# Patient Record
Sex: Male | Born: 1940 | Race: White | Hispanic: No | Marital: Married | Smoking: Former smoker
Health system: Southern US, Community
[De-identification: ages and names within clinical notes are randomized; demographics above are authoritative.]

## PROBLEM LIST (undated history)

## (undated) DIAGNOSIS — M179 Osteoarthritis of knee, unspecified: Secondary | ICD-10-CM

## (undated) DIAGNOSIS — T883XXA Malignant hyperthermia due to anesthesia, initial encounter: Secondary | ICD-10-CM

## (undated) DIAGNOSIS — M51369 Other intervertebral disc degeneration, lumbar region without mention of lumbar back pain or lower extremity pain: Secondary | ICD-10-CM

## (undated) DIAGNOSIS — N189 Chronic kidney disease, unspecified: Secondary | ICD-10-CM

## (undated) DIAGNOSIS — M5136 Other intervertebral disc degeneration, lumbar region: Secondary | ICD-10-CM

## (undated) DIAGNOSIS — Z791 Long term (current) use of non-steroidal anti-inflammatories (NSAID): Secondary | ICD-10-CM

## (undated) DIAGNOSIS — T81718A Complication of other artery following a procedure, not elsewhere classified, initial encounter: Secondary | ICD-10-CM

## (undated) DIAGNOSIS — I2699 Other pulmonary embolism without acute cor pulmonale: Secondary | ICD-10-CM

## (undated) DIAGNOSIS — M1710 Unilateral primary osteoarthritis, unspecified knee: Secondary | ICD-10-CM

## (undated) DIAGNOSIS — K219 Gastro-esophageal reflux disease without esophagitis: Secondary | ICD-10-CM

## (undated) DIAGNOSIS — R0602 Shortness of breath: Secondary | ICD-10-CM

## (undated) HISTORY — PX: BACK SURGERY: SHX140

## (undated) HISTORY — PX: OTHER SURGICAL HISTORY: SHX169

---

## 2003-05-23 ENCOUNTER — Ambulatory Visit (HOSPITAL_COMMUNITY): Admission: RE | Admit: 2003-05-23 | Discharge: 2003-05-23 | Payer: Self-pay | Admitting: Gastroenterology

## 2003-08-15 ENCOUNTER — Encounter: Admission: RE | Admit: 2003-08-15 | Discharge: 2003-08-15 | Payer: Self-pay | Admitting: Family Medicine

## 2003-09-05 ENCOUNTER — Encounter: Admission: RE | Admit: 2003-09-05 | Discharge: 2003-09-05 | Payer: Self-pay | Admitting: Neurosurgery

## 2003-09-20 ENCOUNTER — Encounter: Admission: RE | Admit: 2003-09-20 | Discharge: 2003-09-20 | Payer: Self-pay | Admitting: Neurosurgery

## 2003-10-17 ENCOUNTER — Emergency Department (HOSPITAL_COMMUNITY): Admission: EM | Admit: 2003-10-17 | Discharge: 2003-10-17 | Payer: Self-pay | Admitting: Emergency Medicine

## 2003-10-18 ENCOUNTER — Encounter: Admission: RE | Admit: 2003-10-18 | Discharge: 2003-10-18 | Payer: Self-pay | Admitting: Neurosurgery

## 2003-10-30 ENCOUNTER — Encounter: Admission: RE | Admit: 2003-10-30 | Discharge: 2003-11-16 | Payer: Self-pay | Admitting: Neurosurgery

## 2003-11-19 ENCOUNTER — Inpatient Hospital Stay (HOSPITAL_COMMUNITY): Admission: RE | Admit: 2003-11-19 | Discharge: 2003-11-21 | Payer: Self-pay | Admitting: Neurosurgery

## 2003-12-28 ENCOUNTER — Inpatient Hospital Stay (HOSPITAL_COMMUNITY): Admission: EM | Admit: 2003-12-28 | Discharge: 2003-12-31 | Payer: Self-pay | Admitting: Emergency Medicine

## 2004-03-10 ENCOUNTER — Encounter: Admission: RE | Admit: 2004-03-10 | Discharge: 2004-04-08 | Payer: Self-pay | Admitting: Neurosurgery

## 2004-08-06 ENCOUNTER — Encounter: Admission: RE | Admit: 2004-08-06 | Discharge: 2004-08-06 | Payer: Self-pay | Admitting: Family Medicine

## 2006-11-15 ENCOUNTER — Encounter: Admission: RE | Admit: 2006-11-15 | Discharge: 2006-11-15 | Payer: Self-pay | Admitting: Neurosurgery

## 2007-03-12 ENCOUNTER — Emergency Department (HOSPITAL_COMMUNITY): Admission: EM | Admit: 2007-03-12 | Discharge: 2007-03-12 | Payer: Self-pay | Admitting: Emergency Medicine

## 2007-11-18 ENCOUNTER — Ambulatory Visit: Payer: Self-pay | Admitting: Vascular Surgery

## 2008-11-30 ENCOUNTER — Observation Stay (HOSPITAL_COMMUNITY): Admission: EM | Admit: 2008-11-30 | Discharge: 2008-12-02 | Payer: Self-pay | Admitting: Emergency Medicine

## 2010-10-06 LAB — COMPREHENSIVE METABOLIC PANEL
ALT: 17 U/L (ref 0–53)
Alkaline Phosphatase: 54 U/L (ref 39–117)
CO2: 25 mEq/L (ref 19–32)
Glucose, Bld: 124 mg/dL — ABNORMAL HIGH (ref 70–99)
Potassium: 3.4 mEq/L — ABNORMAL LOW (ref 3.5–5.1)
Sodium: 144 mEq/L (ref 135–145)
Total Protein: 6.7 g/dL (ref 6.0–8.3)

## 2010-10-06 LAB — LIPID PANEL
Cholesterol: 147 mg/dL (ref 0–200)
HDL: 44 mg/dL (ref >39)

## 2010-10-06 LAB — POCT CARDIAC MARKERS
CKMB, poc: <1 ng/mL — ABNORMAL LOW (ref 1.0–8.0)
Troponin i, poc: <0.05 ng/mL (ref 0.00–0.09)

## 2010-10-06 LAB — URINE CULTURE: Culture: NO GROWTH

## 2010-10-06 LAB — CARDIAC PANEL(CRET KIN+CKTOT+MB+TROPI)
CK, MB: 1.5 ng/mL (ref 0.3–4.0)
CK, MB: 1.8 ng/mL (ref 0.3–4.0)
Relative Index: INVALID (ref 0.0–2.5)
Relative Index: INVALID (ref 0.0–2.5)
Total CK: 63 U/L (ref 7–232)
Total CK: 73 U/L (ref 7–232)
Troponin I: 0.01 ng/mL (ref 0.00–0.06)
Troponin I: 0.01 ng/mL (ref 0.00–0.06)

## 2010-10-06 LAB — DIFFERENTIAL
Basophils Relative: 1 % (ref 0–1)
Eosinophils Absolute: 0.1 10*3/uL (ref 0.0–0.7)
Eosinophils Relative: 2 % (ref 0–5)
Monocytes Relative: 6 % (ref 3–12)
Neutrophils Relative %: 52 % (ref 43–77)

## 2010-10-06 LAB — URINALYSIS, ROUTINE W REFLEX MICROSCOPIC
Glucose, UA: NEGATIVE mg/dL
Hgb urine dipstick: NEGATIVE
Protein, ur: NEGATIVE mg/dL
Specific Gravity, Urine: 1.006 (ref 1.005–1.030)
pH: 7 (ref 5.0–8.0)

## 2010-10-06 LAB — T4, FREE: Free T4: 1.3 ng/dL (ref 0.80–1.80)

## 2010-10-06 LAB — CBC
Hemoglobin: 14.8 g/dL (ref 13.0–17.0)
RBC: 5.01 MIL/uL (ref 4.22–5.81)
RDW: 13.6 % (ref 11.5–15.5)

## 2010-10-06 LAB — ETHANOL: Alcohol, Ethyl (B): 172 mg/dL — ABNORMAL HIGH (ref 0–10)

## 2010-10-06 LAB — RAPID URINE DRUG SCREEN, HOSP PERFORMED
Barbiturates: NOT DETECTED
Benzodiazepines: NOT DETECTED

## 2010-10-06 LAB — LIPASE, BLOOD: Lipase: 24 U/L (ref 11–59)

## 2010-10-06 LAB — CK TOTAL AND CKMB (NOT AT ARMC): Relative Index: INVALID (ref 0.0–2.5)

## 2010-11-11 NOTE — H&P (Signed)
NAME:  Arthur Brady, KLINKE NO.:  [id number]   MEDICAL RECORD NO.:  [id number]          PATIENT TYPE:  EMS   LOCATION:  ED                           FACILITY:  [facility]   PHYSICIAN:  Vania Rea, M.D. DATE OF BIRTH:  1941/06/12   DATE OF ADMISSION:  11/30/2008  DATE OF DISCHARGE:                              HISTORY & PHYSICAL   PRIMARY CARE PHYSICIAN:  Unassigned.   CHIEF COMPLAINT:  Sudden onset of chest pain.   HISTORY OF PRESENT ILLNESS:  This is a 70 year old Caucasian gentleman  with no significant coronary history but does not visit the doctor very  often.  He does have a history of lumbar spinal fusion in 2005 by Dr.  Floreen Comber and subsequently was evaluated and treated for nonocclusive small  bilateral pulmonary emboli, but has been fairly well since then.  Wife  reports the patient is very active around the house and at around 4  o'clock this afternoon he lifted a very heavy trailer house and suddenly  fell back in severe pain.  The patient says this was pain in his lumbar  area.  However, he apparently did not take any medications for it.  He  does not like taking narcotics and has not been taking ibuprofen.  The  patient drank two beers and then he and his wife went out to dinner with  friends.  Wife says she notes that he kept making frequent visits to the  bathroom and was not himself and eventually indicated that he would like  to go home.  They went home and went to bed and the patient reports as  soon as he laid down, he had sudden severe sharp, central chest pain  associated with numbness and inability to move his left arm.  The  patient became very agitated and his wife brought him to the emergency  room.  The patient has also complained of abdominal pain.  He denies any  nausea or vomiting and he denies any fever, cough or cold.  He denies  any difficulty breathing, but wife confirms that he has been  hyperventilating the whole time.  He denies any  pain in his legs but  episodically he seems to become paralyzed in his legs.   Wife says that the patient takes episodically ibuprofen and sometimes as  much as 9 per day.  She did give him a dose of ibuprofen when the chest  pain started later tonight.  He does have postprandial fullness and  discomfort according to his wife, although the patient is somewhat  denying.   PAST MEDICAL HISTORY:  1. Degenerative joint disease.  2. Arthritis as noted above.  3. Remote history of pulmonary embolus.   MEDICATIONS:  None.   ALLERGIES:  No known drug allergies, but says he is very intolerant to  NARCOTICS.   SOCIAL HISTORY:  Does not describe any specific occupation, but his wife  describes him as being very, very active around the house.  Does not  think anything of lifting a boulder.  He denies tobacco or illicit drug  use.  He has had a significant amount of alcohol today at home and then  at dinner with friends including beer and vodka.   FAMILY HISTORY:  Family history is significant for father who died of  lung cancer, mother who suffered with stomach problems, sisters with  fibromyalgia and lupus.   REVIEW OF SYSTEMS:  The patient complained of sharp pains in his chest.  Not sure whether it gets worse.  Episodic inability to move his left  arm,  episodic inability to move both legs,  pain in his abdomen,  especially the left lower quadrant.  Other than this a 10-point review  of systems is unremarkable.   PHYSICAL EXAMINATION:  GENERAL:  Very anxious and hyperventilating,  elderly Caucasian gentleman who looks much younger than his stated age.  VITALS:  His temperature is 97.2, pulse 67, respirations 20, blood  pressure 147/91.  He is saturating at 98% on room air.  HEENT: His pupils are round and equal.  Mucous membranes pink.  Anicteric.  NECK:  No cervical lymphadenopathy or thyromegaly.  No jugular venous  distention.  No carotid bruit.  CHEST:  Clear to auscultation  bilaterally.  CARDIOVASCULAR:  Regular rhythm without murmur.  ABDOMEN:  Scaphoid and soft.  There are no masses.  He reports exquisite  tenderness all over the abdomen, especially in the left lower quadrant  and reports exquisite tenderness on sternal compression.  However, there  is no significant tenderness when the stethoscope is applied and  pressure to these areas.  EXTREMITIES:  He reports paralysis of his lower extremities, however.  CENTRAL NERVOUS SYSTEM:  Normal cranial nerves II-XII.  No evidence of  lateralizing focal weakness or abnormality of sensation.  SKIN:  Warm and dry and without blemish or ulcers.   LABORATORY DATA:  His CBC is completely normal.  White count of 6.5,  hemoglobin 14.8, MCV 90.6 and platelets 171,000 with normal differential  on his white count.  Sodium is 144, low potassium of 3.4, chloride 109,  CO2 25, glucose 124, BUN 8, creatinine 1.08.  His liver functions are  completely normal.  His D-dimer is undetectable.  His cardiac markers  show his myoglobin is 35 with undetectable MB and troponins.  His lipase  is 24.  His alcohol level is 172.  His chest x-ray shows stable mild  chronic interstitial lung disease.  No acute abnormality.  His EKG shows  normal sinus rhythm with incomplete right bundle branch block and  prolonged QT.   ASSESSMENT:  1. Acute chest pain associated with atypical neurological symptoms,      questionable acute anxiety sundrome.  2. Alcohol intoxication.  3. Hypokalemia.  4. History of arthritis, neck.   PLAN:  Will admit this gentleman for serial cardiac enzymes.  Will give  him twice daily, intravenous Protonix since he could be having peptic  ulcer disease.  Will give him a dose of Ativan IV and see how he reacts  that.  He has given me permission to discuss his condition with his wife  and I have discussed my suspicion for an acute stress reaction, but  promise her that we will rule out any physical problems.  CT scan  of the  chest and abdomen was discussed but since we will be giving him some  Ativan, I will wait until the morning to see if these are really  indicated since his exam is so benign.     Vania Rea, M.D.  Electronically Signed  LC/MEDQ  D:  11/30/2008  T:  12/01/2008  Job:  [id number]   cc:   Dr. Caralyn Guile

## 2010-11-11 NOTE — Procedures (Signed)
CAROTID DUPLEX EXAM   INDICATION:  Left-sided neck pain and dizziness.  Patient has been  experiencing dizziness with loss of balance for past few months with  each episode lasting a few seconds.  Patient also complains of transient  feelings that something is in his left eye which he needs to rub.  Patient is also experiencing left-sided weakness since his back surgery  and previous to it.   HISTORY:  Diabetes:  No.  Cardiac:  No.  Hypertension:  No.  Smoking:  No.  Previous Surgery:  No.  CV History:  Amaurosis Fugax No, Paresthesias No, Hemiparesis ?                                       RIGHT             LEFT  Brachial systolic pressure:         128               128  Brachial Doppler waveforms:         WNL               WNL  Vertebral direction of flow:        Antegrade         Antegrade  DUPLEX VELOCITIES (cm/sec)  CCA peak systolic                   67                65  ECA peak systolic                   60                52  ICA peak systolic                   51                55  ICA end diastolic                   23                26  PLAQUE MORPHOLOGY:                  Intimal thickening                  Intimal thickening  PLAQUE AMOUNT:                      Minimal           Minimal  PLAQUE LOCATION:                    Bifurcation, ICA  Bifurcation, ICA   IMPRESSION:  1. 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  2. Patent ECAs.  3. Bilateral vertebral arteries with antegrade flow.  4. Preliminary report faxed to Dr. Stephannie Peters office at 4 p.m. on      11/18/2007.   ___________________________________________  Quita Skye. Hart Rochester, M.D.   PB/MEDQ  D:  11/18/2007  T:  11/18/2007  Job:  [id number]

## 2010-11-11 NOTE — Discharge Summary (Signed)
NAME:  Arthur Brady, Arthur Brady                ACCOUNT NO.:  [id number]   MEDICAL RECORD NO.:  [id number]          PATIENT TYPE:  INP   LOCATION:  1444                         FACILITY:  [facility]   PHYSICIAN:  Hillery Aldo, M.D.   DATE OF BIRTH:  07/25/1940   DATE OF ADMISSION:  11/30/2008  DATE OF DISCHARGE:  12/02/2008                               DISCHARGE SUMMARY   PRIMARY CARE PHYSICIAN:  Unassigned.  The patient previously saw Dr.  Wynetta Emery who is no longer practice.   NEUROSURGEON:  Clydene Fake, M.D.   DISCHARGE DIAGNOSES:  1. Atypical chest pain.  2. Acute on chronic lower back pain with history of lumbar spinal      fusion done by Dr. Phoebe Perch in 2005.  3. History of nonocclusive small bilateral pulmonary emboli.  4. Degenerative joint disease.  5. Osteoarthritis.  6. Alcohol intoxication.  7. Hypertriglyceridemia.   DISCHARGE MEDICATIONS:  Vicodin 5/500 mg 1-2 tablets p.o. q.6 hour  p.r.n. back pain.   CONSULTATIONS:  None.   BRIEF ADMISSION HISTORY OF PRESENT ILLNESS:  The patient is a 70-year-  old male who is in very good health and who is active at baseline who  presented to the hospital with a chief complaint of the sudden onset of  musculoskeletal type left-sided chest pain.  Because the pain was  accompanied by some dyspnea, he was brought to the hospital for  evaluation.  There were also some paresthesias associated with the pain.  He became very anxious and began to hyperventilate and subsequently was  brought to the hospital and admitted for further evaluation and workup.   PROCEDURES AND DIAGNOSTIC STUDIES.:  Chest x-ray on November 30, 2008, showed  stable mild chronic interstitial lung disease with no acute  abnormalities.   DISCHARGE LABORATORY VALUES:  Cardiac markers were negative x3 sets.  Cholesterol was 147, triglycerides 168, HDL 44, LDL 69.  Thyroid  function was normal.  Urine drug screen was negative.  Lipase was 24.  Liver function studies were within  normal limits.  D-dimer was not  elevated.   HOSPITAL COURSE:  1. Atypical chest pain:  The patient's chest pain was very      musculoskeletal in description and occurred after a muscle strain      when he lifted a heavy trailer house.  Given his relatively healthy      lifestyle and the level of activity, in the setting of negative      enzymes in a normal EKG, there is very little chance that this      represents cardiac disease.  At this time, we will treat the      patient's musculoskeletal pain and have him follow up with his      primary care physician if this does not resolve.  Of note, the      patient is currently chest pain free and now is only complaining of      lower back pain.  2. Acute on chronic lower back pain:  The patient does have some      complaints of lower back  pain with radiculopathic symptoms down the      left lower extremity.  Given this, we will recheck an MRI scan with      contrast and have him follow up Dr. Phoebe Perch.  3. Alcohol intoxication:  The patient has no history of alcoholism or      alcohol dependency.  He simply presented to the hospital after      dinner and having had 2 drinks.  4. Hypertriglyceridemia:  The patient was counseled regarding      importance of low-fat diet.  Given his low LDL, no treatment is      indicated at this time.   DISPOSITION:  Will discharge the patient after his MRI scan is obtained.  He can follow up with either a primary care physician or Dr. Phoebe Perch for  the results if they are not immediately available.   Time spent coordinating care for discharge and discharge instructions  equals 35 minutes.      Hillery Aldo, M.D.  Electronically Signed     CR/MEDQ  D:  12/02/2008  T:  12/03/2008  Job:  [id number]   cc:   Clydene Fake, M.D.  Fax: [phone]

## 2010-11-14 NOTE — H&P (Signed)
NAME:  Arthur Brady, Arthur Brady                          ACCOUNT NO.:  [id number]   MEDICAL RECORD NO.:  [id number]                   PATIENT TYPE:  INP   LOCATION:  0349                                 FACILITY:  [facility]   PHYSICIAN:  Hettie Holstein, D.O.                 DATE OF BIRTH:  12/19/1940   DATE OF ADMISSION:  12/28/2003  DATE OF DISCHARGE:                                HISTORY & PHYSICAL   PRIMARY CARE PHYSICIAN:  Talmadge Coventry, M.D.   CHIEF COMPLAINT:  Chest pain.   HISTORY OF PRESENT ILLNESS:  This is a pleasant 70 year old Caucasian male  who recently had a lumbar spine surgery by Dr. Phoebe Perch on Nov 18, 2003.  He  had been doing relatively well at home, remained quite active; however,  yesterday at around 2 p.m. developed sudden pain that originated in his left  lower quadrant and then migrated to his right chest wall.  He states that  this was quite severe and had quite considerable pain with deep inspiration  or cough.  He developed some shortness of breath.  He initially felt as  though he fractured a rib; however, this morning at around 7:00, his  shortness of breath was worsening, and he presented to [facility] emergency  department, at which time Dr. Weldon Inches performed a CT scan of his chest  that revealed nonocclusive bilateral lower lobe pulmonary emboli.  Upon  further questioning, he did report some left lower extremity transient pain  that has resolved to this point.  This was several days ago.  He denies any  lower extremity trauma and immobility at home.  He denies any previous clots  or any family history of clotting disorders.  His family history is  unremarkable in that regard.   PAST MEDICAL HISTORY:  Felt to be healthy.  He denies any hypertension,  diabetes, high cholesterol.  He has undergone screening colonoscopies that  were normal.  He is, with the exception of degenerative spine disease,  rather healthy.   His medications at home include only  Tylenol and Advil for his recent low  back surgery.   ALLERGIES:  1. PENICILLIN.  He was told as a child that he should not receive     penicillin.  He believes that he developed an allergic reaction.  2. He develops arm swelling with a TETANUS SHOT.   SOCIAL HISTORY:  He quit tobacco in 1966.  He used to drink quite regularly  prior to this back surgery in May; however, he drinks very rarely since that  time.  He has two children who are healthy.  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   FAMILY HISTORY:  Significant for a mother with schizophrenia at age 3.  His  father died at age 47 with lung  cancer.  He has four siblings, one brother  and three sisters.  Three of his siblings have back problems similar to his.   REVIEW OF SYSTEMS:  He denies any nausea, vomiting, diarrhea, fever, chills,  night sweats, cough, or hematochezia, hematuria.  As noted above, he had  some left lower extremity complaint possibly a week ago that has resolved.  He denies any lower extremity swelling or erythema.  No lightheadedness or  dizziness.   PHYSICAL EXAMINATION:  VITAL SIGNS:  The patient's temperature was 98.6,  heart rate 78, respirations 18, blood pressure 116/74.  GENERAL:  He is alert, appearing younger than his stated age, only grimacing  with positioning for physical examination.  LUNGS:  Clear bilaterally.  Some palpable tenderness about the right thorax.  HEART:  Normal S1 and S2.  ABDOMEN:  EXTREMITIES:  Some slight calf, though very minimal tenderness.  NEUROLOGIC:  Thymic.  Affect is stable.   LABORATORY DATA:  CT scan with a nonocclusive bilateral lower lobe  subsegmental PE's with bilateral lobe fibrosis noted.   PTT was 21.  PT was 0.9.  Sodium was 139, potassium 3.5, BUN 8, creatinine  0.9, chloride 108, CO2 26, glucose 116.  AST and ALT revealed a 0.4/15  albumin of 3.6 and  a calcium of 9.3.   EKG revealed a normal sinus rhythm.   WBC was 9.8, hemoglobin 12.8, platelets 177, MCV 87.   IMPRESSION:  1. Pleuritic chest pain, status post acute pulmonary embolus, bilateral     lower lobes and subsegmental.  2. Postoperative on May 23.  3. PENICILLIN allergy.   PLAN:  We are going to move forward with weight-based anticoagulation with  heparin per pharmacy protocol, as discussed with Dr. Jeral Fruit, and he does not  feel that this will affect his recent surgery adversely.  In any event, he  will be started on Coumadin.  In addition, it was discussed with the family  that they undergo Lovenox teaching with the anticipation that he is stable  overnight, possibly Sunday he could go home with Lovenox and bridge to a  therapeutic Coumadin level.  It is anticipated that he will require at least  three months of anticoagulation therapy.  We anticipate that this can be  followed closely by his primary care physician.                                               Hettie Holstein, D.O.    ESS/MEDQ  D:  12/28/2003  T:  12/28/2003  Job:  [id number]   cc:   Talmadge Coventry, M.D.  [address]  [place]  [place] [address]  Fax: [phone]

## 2010-11-14 NOTE — Op Note (Signed)
NAME:  Arthur Brady, Arthur Brady                          ACCOUNT NO.:  [id number]   MEDICAL RECORD NO.:  [id number]                   PATIENT TYPE:  AMB   LOCATION:  ENDO                                 FACILITY:  [facility]   PHYSICIAN:  Anselmo Rod, M.D.               DATE OF BIRTH:  1940-07-16   DATE OF PROCEDURE:  05/23/2003  DATE OF DISCHARGE:                                 OPERATIVE REPORT   PROCEDURE PERFORMED:  Screening colonoscopy.   ENDOSCOPIST:  Anselmo Rod, M.D.   INSTRUMENT USED:  Olympus video colonoscope.   INDICATIONS FOR PROCEDURE:  A 70 year old [place] male, undergoing  screening colonoscopy to rule out colonic polyps, masses, etc.   PREPROCEDURE PREPARATION:  Informed consent was procured from the patient.  The patient was fasted for eight hours prior to the procedure and prepped  with a bottle of magnesium citrate and a gallon of GoLYTELY the night prior  to procedure.   PREPROCEDURE PHYSICAL EXAMINATION:  VITAL SIGNS:  The patient with stable  vital signs.  NECK:  Supple.  CHEST:  Clear to auscultation.  S1, S2, regular.  ABDOMEN:  Soft with normal bowel sounds.   DESCRIPTION OF THE PROCEDURE:  The patient was placed in the left lateral  decubitus position, sedated with 25 mcg of fentanyl and 5 mg of Versed  intravenously.  Once the patient was adequately sedated and maintained on  low-flow oxygen and continuous cardiac monitoring, the Olympus video  colonoscope was advanced from the rectum to the cecum.  The patient had some  residual stool in the right colon.  Multiple washes were done.  The  appendiceal orifice and ileocecal valve were clearly visualized and  photographed.  No masses, polyps, erosions, ulcerations, or diverticula were  seen.  Retroflexion in the rectum revealed no abnormalities.   IMPRESSION:  Normal colonoscopy to the cecum.   RECOMMENDATIONS:  1. Repeat colorectal screening is recommended in the next 10 years unless     the  patient develops any abnormal symptoms in the interim.  2. Outpatient follow-up as need arises in the future.                                               Anselmo Rod, M.D.    JNM/MEDQ  D:  05/23/2003  T:  05/23/2003  Job:  [id number]   cc:   Talmadge Coventry, M.D.  [address], [address]  [place]  [place] [address]  Fax: [phone]

## 2010-11-14 NOTE — Op Note (Signed)
NAME:  Arthur Brady, Arthur Brady                          ACCOUNT NO.:  [id number]   MEDICAL RECORD NO.:  [id number]                   PATIENT TYPE:  INP   LOCATION:  3014                                 FACILITY:  [facility]   PHYSICIAN:  Clydene Fake, M.D.               DATE OF BIRTH:  December 16, 1940   DATE OF PROCEDURE:  11/19/2003  DATE OF DISCHARGE:                                 OPERATIVE REPORT   PREOPERATIVE DIAGNOSIS:  Herniated nucleus pulposus, degenerative disk  disease, spondylosis.   POSTOPERATIVE DIAGNOSIS:  Herniated nucleus pulposus, degenerative disk  disease, spondylosis.   OPERATION PERFORMED:  L4-5 and L5-S1 posterior lumbar interbody fusion with  Brantigan interbody cages at L4-5 and L5-S1, segmented Expedium pedicle  screw fixation at L4 through S1, L4 to S1 (two levels) posterior lumbar  interbody fusion, autograft same incision, allograft same incision,  allograft, Synthes system.   SURGEON:  Clydene Fake, M.D.   ASSISTANT:  Payton Doughty, M.D.   ANESTHESIA:  General endotracheal tube.   ESTIMATED BLOOD LOSS:  600 mL.   BLOOD REPLACED:  300 mL Cell Saver blood returned.   COMPLICATIONS:  None.   DRAINS:  None.   INDICATIONS FOR PROCEDURE:  The patient is a 70 year old gentleman who has  had a long history of low back pain on and off for the last couple of years.  He said his back had give out a few times a year, that lasts a couple of  weeks, but over the last few months he has felt pain in his back that  continues with pain in the left side radiating down his leg.  Back and leg  pain seem to be equal when he walks a small distance before he stops and  rests.  Steroids, anti-inflammatories, epidural steroids, none of this has  given any lasting relief.  Patient brought for decompression and fusion.   DESCRIPTION OF PROCEDURE:  The patient was brought to the operating room,  general anesthesia induced.  Patient placed in prone position on the Wilson  frame  with all pressure points padded.  The patient was prepped and draped  in a sterile fashion.  The site of incision was injected with 10 mL of 1%  lidocaine with epinephrine.  An incision was then made in the midline lower  lumbar spine.  Incision was taken down to the fascia.  Hemostasis was  obtained with Bovie cautery.  The fascia was incised and subperiosteal  dissection was done over the 3, 4, 5, S1 spinous processes and lamina out to  the facet.  The transverse processes of L4 and 5 and the lateral sacrum were  exposed and self-retaining retractors were placed.  Markers were placed over  the pedicles of 4, 5, and 1 and fluoroscopy was used to confirm our  position.  Laminectomy was then performed removing the lateral half of L4,  of L5 and  doing medial facetectomies bilaterally at the 4-5 and 5-1 levels  with Leksell rongeurs and Kerrison punches.  I did foraminotomies over the  nerve roots, 5 and S1 roots.  These were over the left side at 4-5 and  working on the medial facetectomy, a large piece of disk extruded out from  the epidural space.  We removed this.  We explored the disk space.  There  were no other further pieces of disk but there was a disk bulge especially  on this side and diskectomy was performed with pituitary rongeurs.  This was  completed on the other side at 4-5.  We then distracted the interspace.  The  interspace distracted up to 11 mm.  We used the broaches to prepare the  interspace for interbody fusion.  We used the final cutting broach, 11991,  to remove cartilaginous end plates and scraped the cartilaginous end plates  with curets and removed all disk material.  All the bone that was removed  during laminectomy was cleaned of all the soft tissue and chopped up in  small pieces and placed through the Symphony system to get the platelet rich  plasma mixed in with the bone. This bone was then impacted in the interspace  and two 11 x 9 x 21 long Brantigan cages  were packed with this autograft  bone mixture and then tamped into place in the 4-5 interspace.  We repeated  this process of diskectomy, distraction and preparing the interspace for  fusion at L5-S1 distracting up to 9 mm and performing foraminotomies making  sure the nerve roots were decompressed. Used a final broach #9 wide, scraped  out cartilaginous end plates and packed the disk space with the autograft  and symphony bone mixture back to 9 x 9 cages with this autograft bone  mixture and tapped them into placed. When we were finished, we had good  position of the four cages and good restoration of disk heights at L4-5 and  5-1.  Attention was then taken to the lateral facets and transverse  processes and these were decorticated from L4 to S1 for posterolateral  fusion later in the case.  The pedicle entry points were then found using  intraoperative markers, feeling the pedicle with the probe and use of  fluoroscopy.  The area over the pedicle entry was then decorticated  and  pedicle probe placed down the pedicle and small bone probe making sure we  had good bone all the way around, tapped the probe, tapped the pedicle again  using small bone probe making sure we had good bony ridges all the way  around.  We then placed a pedicle screw.  This was repeated bilaterally at  L4, 5 and S1.  6 x 50 mm screw was placed at L4, 6 mm wide x 45 mm deep were  placed in L5 and 6 x 40 were placed in S1.  These were Expedium pedicle  screws.  A 45 mm rod was placed in the screw heads. We tightened down  __________ the locking nuts, tightening one nut at L4 bilaterally with some  compression over 4-5, then tightened the 5 nut with compression over 5-1,  tightening the S1 nut over the screw and this was repeated on the opposite  side.  When we were finished, AP and lateral fluoroscopic images were  obtained showing good position and alignment of the spine, good position of all of the instrumentation.   The wound was irrigated with antibiotic  solution.  The rest of the autograft bone and then some allograft bone that  was also placed through the Symphony system for the platelet rich plasma ws  then packed into the posterolateral gutters for L4 to S1 fusion  posterolaterally bilaterally.  Retractors were removed.  Paraspinous muscle  closed with 0 Vicryl interrupted suture.  The fascia closed with 0 Vicryl  interrupted suture and the subcutaneous tissue closed with 0, 2-0 and 3-0  Vicryl interrupted suture and the skin closed with Benzoin and Steri-Strips.  Dressing was placed.  Patient was placed back in the supine position,  awakened from anesthesia and transferred to recovery room in stable  condition.                                               Clydene Fake, M.D.    JRH/MEDQ  D:  11/19/2003  T:  11/20/2003  Job:  [id number]

## 2010-11-14 NOTE — Discharge Summary (Signed)
NAME:  Arthur Brady, Arthur Brady                          ACCOUNT NO.:  [id number]   MEDICAL RECORD NO.:  [id number]                   PATIENT TYPE:  INP   LOCATION:  0349                                 FACILITY:  [facility]   PHYSICIAN:  Hettie Holstein, D.O.                 DATE OF BIRTH:  09/12/1940   DATE OF ADMISSION:  12/28/2003  DATE OF DISCHARGE:  12/31/2003                                 DISCHARGE SUMMARY   PRIMARY CARE PHYSICIAN:  Dr. Rise Mu Mazzocchi.   ADMISSION DIAGNOSIS:  Chest pain.   DISCHARGE DIAGNOSES:  Bilateral nonocclusive pulmonary emboli, subsegmental  and early bilateral lower lobe fibrosis on a chest CT.   MEDICATIONS ON DISCHARGE:  1. Coumadin 7.5 mg daily.  The patient received 3 doses of 7.5 mg while in     the hospital, and his INR increased from 0.9 to 1.2.  He is receiving 10     mg on today's date, December 31, 2003,  with anticipation that his status will     be in this range, 7.5-10.0 mg q.h.s.  He does have a follow-up PT/INR on     July 7, and be followed by his primary care physician, Dr. Talmadge Coventry.  2. He is being placed on a Lovenox bridge to a therapeutic INR with INR goal     of 2-3.0.  Lovenox is weight based at 70 mg subcu q.12h., and the patient     has been instructed to self-administer.  He is provided with a supple of     10 days and 1 refill.   ADDITIONAL MEDICATIONS:  Over-the-counter ibuprofen p.r.n. for discomfort  and pain.   ACTIVITY:  He was instructed to limit his activity for the next couple of  weeks.  Follow activity instructions as provided by Dr. Phoebe Perch following his  disk surgery.   DIET:  He was instructed on adhering to the Coumadin booklet.   WOUND CARE:  Not applicable.   SPECIAL INSTRUCTIONS:  The patient is instructed to have a PT/INR checked  every 3-4 days until his INR stabilizes between 2-3.0; this should be  followed along with his primary care physician follow-up appointment, and it  is July 4, and the  office cannot be contacted.  I am asking him to call Dr.  Talmadge Coventry this week to schedule a follow-up appointment and have  blood work obtained at her office on January 03, 2004.   HISTORY OF PRESENTING ILLNESS:  This is a pleasant 70 year old Caucasian  male who recently had lumbar spine surgery by Dr. Phoebe Perch on Nov 18, 2003,  been doing relatively well at home.  He remained fairly active; however,  yesterday around 2 p.m. he developed sudden pain and rigidity in his left  lower quadrant and migrated to his right chest wall.  He states this was  quite severe and had considerable pain  with deep inspiration or cough.  He  developed some shortness of breath.  Initially, it felt as though he  fractured a rib; however, the morning of presentation his shortness of  breath was worsening, and he presented to [facility] ER at which time Dr.  __________ performed a CT scan of his chest that revealed nonocclusive  bilateral lower lobe pulmonary emboli.  On further questioning, he reported  some left lower extremity transient pain that has resolved over the course  of the past few days.  He denies any lower extremity trauma or any mobility  abnormality.  He denies any previous clots or family history of clotting  disorders.   HOSPITAL COURSE:  He was admitted and started on a heparin drip for the  first day and had some pain medication administered, and he did fairly well  with good oxygen saturation and remaining hemodynamically stable.  His  hemoglobin remained stable, and his INR slowly increased to 1.2 from 0.9,  and pharmacy assisted with the dose.  Suspect that he may require a dose of  anywhere from 7.5-10 mg daily.  He is being sent home on a Lovenox bridge in  stable condition.  For further questions, please call __________.                                               Hettie Holstein, D.O.    ESS/MEDQ  D:  12/31/2003  T:  12/31/2003  Job:  [id number]   cc:   Talmadge Coventry,  M.D.  [address]  [place]  [place] [address]  Fax: [phone]   Clydene Fake, M.D.  [address]  [place]  [place] [address]  Fax: [phone]

## 2011-05-07 ENCOUNTER — Other Ambulatory Visit: Payer: Self-pay | Admitting: Internal Medicine

## 2011-05-07 ENCOUNTER — Ambulatory Visit
Admission: RE | Admit: 2011-05-07 | Discharge: 2011-05-07 | Disposition: A | Discharge status: Home / Self Care | Payer: Medicare Other | Source: Ambulatory Visit | Attending: Internal Medicine | Admitting: Internal Medicine

## 2011-05-07 DIAGNOSIS — R1013 Epigastric pain: Secondary | ICD-10-CM

## 2011-05-08 ENCOUNTER — Other Ambulatory Visit: Payer: Self-pay | Admitting: Internal Medicine

## 2011-05-08 ENCOUNTER — Encounter: Payer: Self-pay | Admitting: Internal Medicine

## 2011-05-08 ENCOUNTER — Inpatient Hospital Stay (HOSPITAL_COMMUNITY): Payer: Medicare Other

## 2011-05-08 ENCOUNTER — Inpatient Hospital Stay (HOSPITAL_COMMUNITY)
Admission: AD | Admit: 2011-05-08 | Discharge: 2011-05-09 | DRG: 392 | Disposition: A | Discharge status: Home / Self Care | Payer: Medicare Other | Source: Ambulatory Visit | Attending: Internal Medicine | Admitting: Internal Medicine

## 2011-05-08 ENCOUNTER — Other Ambulatory Visit: Payer: Self-pay

## 2011-05-08 DIAGNOSIS — K219 Gastro-esophageal reflux disease without esophagitis: Secondary | ICD-10-CM | POA: Diagnosis present

## 2011-05-08 DIAGNOSIS — M51379 Other intervertebral disc degeneration, lumbosacral region without mention of lumbar back pain or lower extremity pain: Secondary | ICD-10-CM | POA: Diagnosis present

## 2011-05-08 DIAGNOSIS — Z791 Long term (current) use of non-steroidal anti-inflammatories (NSAID): Secondary | ICD-10-CM | POA: Insufficient documentation

## 2011-05-08 DIAGNOSIS — N1330 Unspecified hydronephrosis: Secondary | ICD-10-CM | POA: Insufficient documentation

## 2011-05-08 DIAGNOSIS — Z86711 Personal history of pulmonary embolism: Secondary | ICD-10-CM

## 2011-05-08 DIAGNOSIS — Z887 Allergy status to serum and vaccine status: Secondary | ICD-10-CM

## 2011-05-08 DIAGNOSIS — N189 Chronic kidney disease, unspecified: Secondary | ICD-10-CM | POA: Diagnosis present

## 2011-05-08 DIAGNOSIS — Z87891 Personal history of nicotine dependence: Secondary | ICD-10-CM

## 2011-05-08 DIAGNOSIS — R109 Unspecified abdominal pain: Secondary | ICD-10-CM | POA: Insufficient documentation

## 2011-05-08 DIAGNOSIS — R1031 Right lower quadrant pain: Principal | ICD-10-CM | POA: Diagnosis present

## 2011-05-08 DIAGNOSIS — Z888 Allergy status to other drugs, medicaments and biological substances status: Secondary | ICD-10-CM

## 2011-05-08 DIAGNOSIS — Z87442 Personal history of urinary calculi: Secondary | ICD-10-CM

## 2011-05-08 DIAGNOSIS — M1710 Unilateral primary osteoarthritis, unspecified knee: Secondary | ICD-10-CM | POA: Diagnosis present

## 2011-05-08 DIAGNOSIS — M5137 Other intervertebral disc degeneration, lumbosacral region: Secondary | ICD-10-CM | POA: Diagnosis present

## 2011-05-08 DIAGNOSIS — Z880 Allergy status to penicillin: Secondary | ICD-10-CM

## 2011-05-08 HISTORY — DX: Other pulmonary embolism without acute cor pulmonale: I26.99

## 2011-05-08 HISTORY — DX: Chronic kidney disease, unspecified: N18.9

## 2011-05-08 HISTORY — DX: Shortness of breath: R06.02

## 2011-05-08 HISTORY — DX: Other intervertebral disc degeneration, lumbar region: M51.36

## 2011-05-08 HISTORY — DX: Long term (current) use of non-steroidal anti-inflammatories (nsaid): Z79.1

## 2011-05-08 HISTORY — DX: Unilateral primary osteoarthritis, unspecified knee: M17.10

## 2011-05-08 HISTORY — DX: Complication of other artery following a procedure, not elsewhere classified, initial encounter: T81.718A

## 2011-05-08 HISTORY — DX: Malignant hyperthermia due to anesthesia, initial encounter: T88.3XXA

## 2011-05-08 HISTORY — DX: Osteoarthritis of knee, unspecified: M17.9

## 2011-05-08 HISTORY — DX: Other intervertebral disc degeneration, lumbar region without mention of lumbar back pain or lower extremity pain: M51.369

## 2011-05-08 HISTORY — DX: Gastro-esophageal reflux disease without esophagitis: K21.9

## 2011-05-08 LAB — CARDIAC PANEL(CRET KIN+CKTOT+MB+TROPI)
CK, MB: 2.6 ng/mL (ref 0.3–4.0)
Relative Index: INVALID (ref 0.0–2.5)
Total CK: 60 U/L (ref 7–232)
Troponin I: <0.3 ng/mL (ref <0.30)

## 2011-05-08 LAB — CREATININE, SERUM
Creatinine, Ser: 1.01 mg/dL (ref 0.50–1.35)
GFR calc non Af Amer: 74 mL/min — ABNORMAL LOW (ref >90)

## 2011-05-08 LAB — BUN: BUN: 11 mg/dL (ref 6–23)

## 2011-05-08 MED ORDER — PANTOPRAZOLE SODIUM 40 MG IV SOLR
40.0000 mg | Freq: Two times a day (BID) | INTRAVENOUS | Status: DC
  Administered 2011-05-08 – 2011-05-09 (×4): 40 mg via INTRAVENOUS
  Filled 2011-05-08 (×5): qty 40

## 2011-05-08 MED ORDER — DEXTROSE-NACL 5-0.9 % IV SOLN: 100.0000 mL/h | INTRAVENOUS | Status: DC

## 2011-05-08 MED ORDER — ONDANSETRON HCL 4 MG PO TABS: 4.0000 mg | ORAL_TABLET | Freq: Three times a day (TID) | ORAL | Status: DC | PRN

## 2011-05-08 MED ORDER — PANTOPRAZOLE SODIUM 40 MG IV SOLR: 40.0000 mg | Freq: Two times a day (BID) | INTRAVENOUS | Status: DC

## 2011-05-08 MED ORDER — ONDANSETRON HCL 4 MG PO TABS: 4.0000 mg | ORAL_TABLET | Freq: Every day | ORAL | Status: DC | PRN

## 2011-05-08 MED ORDER — DEXTROSE-NACL 5-0.9 % IV SOLN
INTRAVENOUS | Status: DC
  Administered 2011-05-08 – 2011-05-09 (×3): via INTRAVENOUS

## 2011-05-08 MED ORDER — IOHEXOL 300 MG/ML  SOLN
80.0000 mL | Freq: Once | INTRAMUSCULAR | Status: AC | PRN
  Administered 2011-05-08: 80 mL via INTRAVENOUS

## 2011-05-08 NOTE — H&P (Signed)
Arthur Brady is an 70 y.o. male.   Chief Complaint: abdominal pain for 2 days HPI:Mr. Britney Captain is a very pleasant 70 year old male with a history of kidney stones. He has severe reactions to narcotics and he should not be given. Yesterday he was seen in my office with epigastric pain and had complained of light colored stools and we checked an abdominal ultrasound which showed mild left hydronephrosis but otherwise normal. There were no gallstones or common duct dilatation. Abdominal pain has worsened today and after having labs drawn in my office he almost had syncope. He is admitted now for further workup and abdominal and pelvic CT scanning has been ordered along with IV fluids. Again, narcotics cause severe agitation and hallucinations as well as codeine  Past Medical History  Diagnosis Date  . Abdominal pain   . DJD (degenerative joint disease) of knee   . Pulmonary embolism, iatrogenic   . Lumbar degenerative disc disease   . NSAID long-term use     No past surgical history on file.  No family history on file. Social History:  does not have a smoking history on file. He does not have any smokeless tobacco history on file. His alcohol and drug histories not on file.  Allergies:  Allergies  Allergen Reactions  . Codeine Phosphate     Agitation and hallucinations  . Morphine And Related     Severe hallucinations and agitation  . Penicillins     Severe reaction as a child  . Tetanus Toxoids     Severe reaction  . Voltaren (Diclofenac Sodium)     Abdominal pain and nausea    No current outpatient prescriptions on file as of 05/08/2011.   No current facility-administered medications on file as of 05/08/2011.    No results found for this or any previous visit (from the past 48 hour(s)). US Abdomen Complete  05/07/2011  *RADIOLOGY REPORT*  Clinical Data:  Epigastric pain, evaluate for gallstones  COMPLETE ABDOMINAL ULTRASOUND  Comparison:  None.  Findings:  Gallbladder:  No  gallstones, gallbladder wall thickening, or pericholecystic fluid.  Negative sonographic Murphy's sign.  Common bile duct:  Measures 4 mm.  Liver:  Small hepatic cysts, the largest measuring 1.9 x 2.0 x 2.1 cm in the left hepatic lobe.  Within normal limits in parenchymal echogenicity.  IVC:  Appears normal.  Pancreas:  Poorly visualized due to overlying bowel gas.  Spleen:  Measures 9.9 cm.  Right Kidney:  Measures 10.6 cm.  No mass or hydronephrosis.  Left Kidney:  Measures 10.3 cm.  Suspected mild hydronephrosis.  Abdominal aorta:  No aneurysm identified.  Atherosclerosis.  IMPRESSION: Normal sonographic appearance the gallbladder.  Suspected mild left hydronephrosis.  If there is clinical concern for ureteral calculus, CT abdomen/pelvis without contrast is suggested.  Findings discussed with and acknowledged by Dr. Marden Noble on 05/07/2011 at 1435 hours.  Original Report Authenticated By: Charline Bills, M.D.    ROS  Denies vomiting. No melena or bright red blood per rectum. Had light-colored stool 2 days ago. No history of cholecystectomy or other abdominal surgery  There were no vitals taken for this visit. Physical Exam  Constitutional: He is oriented to person, place, and time. He appears well-developed and well-nourished. He appears distressed.  HENT:  Head: Normocephalic and atraumatic.  Eyes: EOM are normal. Pupils are equal, round, and reactive to light.  Neck: Normal range of motion. Neck supple. No JVD present. No tracheal deviation present. No thyromegaly present.  Cardiovascular: Normal rate, regular rhythm and normal heart sounds.   Respiratory: Breath sounds normal. No stridor. He has no wheezes. He has no rales.  GI: Bowel sounds are normal. He exhibits no distension and no mass. There is tenderness. There is rebound and guarding.  Genitourinary: Penis normal.  Musculoskeletal: Normal range of motion.  Lymphadenopathy:    He has no cervical adenopathy.  Neurological: He is  alert and oriented to person, place, and time. He exhibits normal muscle tone. Coordination normal.  Skin: Skin is warm and dry. No rash noted. No pallor.  Psychiatric: He has a normal mood and affect.   Physical Exam  Constitutional: He is oriented to person, place, and time. He appears well-developed and well-nourished. He appears distressed.  HENT:  Head: Normocephalic and atraumatic.  Eyes: EOM are normal. Pupils are equal, round, and reactive to light.  Neck: Normal range of motion. Neck supple. No JVD present. No tracheal deviation present. No thyromegaly present.  Cardiovascular: Normal rate, regular rhythm and normal heart sounds.   Pulmonary/Chest: Breath sounds normal. No stridor. He has no wheezes. He has no rales.  Abdominal: Bowel sounds are normal. He exhibits no distension and no mass. There is tenderness. There is rebound and guarding.  Genitourinary: Penis normal.  Musculoskeletal: Normal range of motion.  Lymphadenopathy:    He has no cervical adenopathy.  Neurological: He is alert and oriented to person, place, and time. He exhibits normal muscle tone. Coordination normal.  Skin: Skin is warm and dry. No rash noted. No pallor.  Psychiatric: He has a normal mood and affect.    Assessment/Plan Patient Active Problem List  Diagnoses Date Noted  . Abdominal pain - start IV fluids and IV Zofran if necessary. Check abdominal CT and pelvic CT with contrast and obtain surgical consult 05/08/2011  . Hydronephrosis, left - aware. Could be passing a kidney stone 05/08/2011  . NSAID long-term use - with use of Advil, could have an ulcer or perforation     Nolie Bignell NEVILL 05/08/2011, 11:14 AM

## 2011-05-08 NOTE — Progress Notes (Signed)
Notified Dr. Nehemiah Settle that toradol contraindicated, pt allergy to voltaren. Toradol not ordered. Emelda Brothers [place]

## 2011-05-08 NOTE — Consult Note (Signed)
Arthur Brady [birth year]  [id number].   Primary Care MD: Dr. Kevan Ny Requesting MD: Dr. Kevan Ny Chief Complaint/Reason for Consult: abdominal pain HPI: This is a 70 year old white male who presented to Dr. Kevan Ny' office yesterday complaining of abdominal pain. At that time, the patient states his pain was periumbilical radiating to the right side of his abdomen. He had no nausea and vomiting that time. He did have several bowel movements which he states were lighter in color than normal but still brown. He was sent for an abdominal ultrasound to rule out a gallbladder etiology. This ultrasound revealed a normal gallbladder with no evidence of gallstones. It did however, showed some left-sided hydronephrosis. The patient does have a history of kidney stones but none were seen. Patient was supposed to get a urinalysis however he did not. Due to continue worsening pain the patient presented back to Dr. Kevan Ny his office today. At this time, secondary to the amount of pain the patient was having Dr. Kevan Ny felt appropriate for direct admission to the hospital. The patient admits to some occasional hot flashes and cold chills but has not taken his temperature. Currently he states that he had mild nausea this morning but no other emesis. His pain is now more localized in the right lower quadrant. We have been asked to evaluate the patient upon admission for further recommendations.  Review of systems: Please see history of present illness otherwise all other systems have been reviewed and are negative.  History reviewed. No pertinent family history.  Past Medical History  Diagnosis Date  . Abdominal pain   . DJD (degenerative joint disease) of knee   . Pulmonary embolism, iatrogenic   . Lumbar degenerative disc disease   . NSAID long-term use   . Malignant hyperthermia   . Shortness of breath   . Chronic kidney disease     has had kidney stones  . GERD (gastroesophageal reflux disease)     Past Surgical  History  Procedure Date  . Back surgery   . Arthroscopy of knee left     Social History:  reports that he quit smoking about 46 years ago. His smoking use included Cigarettes. He has a 24 pack-year smoking history. He does not have any smokeless tobacco history on file. He reports that he drinks about .6 ounces of alcohol per week. He reports that he does not use illicit drugs.  Allergies:  Allergies  Allergen Reactions  . Codeine Phosphate     Agitation and hallucinations  . Morphine And Related     Severe hallucinations and agitation  . Penicillins     Severe reaction as a child  . Tetanus Toxoids     Severe reaction  . Voltaren (Diclofenac Sodium)     Abdominal pain and nausea    Medications Prior to Admission  Medication Dose Route Frequency Provider Last Rate Last Dose  . dextrose 5 %-0.9 % sodium chloride infusion   Intravenous Continuous Pearla Dubonnet, MD      . ondansetron [facility]) tablet 4 mg  4 mg Oral Daily PRN Pearla Dubonnet, MD      . pantoprazole (PROTONIX) injection 40 mg  40 mg Intravenous Q12H Pearla Dubonnet, MD      . DISCONTD: ondansetron [facility]) tablet 4 mg  4 mg Oral Q8H PRN Pearla Dubonnet, MD       No current outpatient prescriptions on file as of 05/08/2011.    Blood pressure 132/74, pulse 68, temperature 98.3 F (  36.8 C), temperature source Oral, resp. rate 19, height 5\' 7"  (1.702 m), weight 148 lb 9.6 oz (67.405 kg), SpO2 100.00%. Physical exam: Gen.: The patient is a 70 year old white male who is currently laying in bed in no acute distress but is otherwise well-developed and well-nourished. HEENT: Head is normocephalic, atraumatic. Sclerae are non-injected. Pupils are equal, round, reactive to light. Ears and nose without any obvious masses or lesions. No rhinorrhea. Mouth is pink and throat shows no exudate. Heart: Regular, rate, rhythm. Normal S1-S2. No murmurs, gallops, rubs. The patient does have palpable carotid, radial, pedal  pulses bilaterally. Lungs: Clear to auscultation bilaterally with no wheezes, rhonchi, rales. Respiratory effort is non-labored. Abdomen: Soft with tenderness at McBurney's point in the right lower quadrant. He does have active bowel sounds. He is nondistended. He has involuntary guarding in the right lower quadrant but otherwise no signs of peritonitis. No other masses, or hernias, organomegaly are noted. Musculoskeletal: All 4 extremities are symmetrical with no cyanosis, clubbing, edema. Skin: Warm and dry. No masses, lesions, rashes. Psychiatric: The patient is alert and oriented x3 with an appropriate affect.  No results found for this or any previous visit (from the past 48 hour(s)). US Abdomen Complete  05/07/2011  *RADIOLOGY REPORT*  Clinical Data:  Epigastric pain, evaluate for gallstones  COMPLETE ABDOMINAL ULTRASOUND  Comparison:  None.  Findings:  Gallbladder:  No gallstones, gallbladder wall thickening, or pericholecystic fluid.  Negative sonographic Murphy's sign.  Common bile duct:  Measures 4 mm.  Liver:  Small hepatic cysts, the largest measuring 1.9 x 2.0 x 2.1 cm in the left hepatic lobe.  Within normal limits in parenchymal echogenicity.  IVC:  Appears normal.  Pancreas:  Poorly visualized due to overlying bowel gas.  Spleen:  Measures 9.9 cm.  Right Kidney:  Measures 10.6 cm.  No mass or hydronephrosis.  Left Kidney:  Measures 10.3 cm.  Suspected mild hydronephrosis.  Abdominal aorta:  No aneurysm identified.  Atherosclerosis.  IMPRESSION: Normal sonographic appearance the gallbladder.  Suspected mild left hydronephrosis.  If there is clinical concern for ureteral calculus, CT abdomen/pelvis without contrast is suggested.  Findings discussed with and acknowledged by Dr. Marden Noble on 05/07/2011 at 1435 hours.  Original Report Authenticated By: Charline Bills, M.D.       Assessment/Plan 1. abdominal pain, rule out appendicitis  Plan: The patient's chronology of symptoms is  not straightforward. However, the patient did complain of some periumbilical and right mid quadrant pain yesterday. Today his pain is more localized in the right lower quadrant. This makes me suspicious for a possible appendicitis. The patient denies any sick contacts or essentially nausea, vomiting, or diarrhea. I do not feel that his pain is related to gastroenteritis; however, this is still a possibility. The patient could have diverticulitis on the right side of his colon; however, the patient did have a colonoscopy 8 years ago which was completely clean. At this time, we feel that getting a CT scan of the abdomen and pelvis will be very beneficial in determining the source of the patient's pain. If his CT scan does show appendicitis, the patient will need surgical intervention tonight. We agree with keeping the patient n.p.o. in the meantime. I'll discuss this patient with my physician and we will be on the look out for the CT scan results. We will make further recommendations after this has been completed. Thank you for this consultation. We will follow along with you.  Agree OSBORNE,KELLY E 05/08/2011, 2:34 PM

## 2011-05-09 LAB — CARDIAC PANEL(CRET KIN+CKTOT+MB+TROPI)
CK, MB: 2.1 ng/mL (ref 0.3–4.0)
Relative Index: INVALID (ref 0.0–2.5)
Troponin I: <0.3 ng/mL (ref <0.30)

## 2011-05-09 MED ORDER — PANTOPRAZOLE SODIUM 40 MG PO TBEC
40.0000 mg | DELAYED_RELEASE_TABLET | Freq: Every day | ORAL | Status: DC
  Administered 2011-05-09: 40 mg via ORAL
  Filled 2011-05-09: qty 1

## 2011-05-09 MED ORDER — OMEPRAZOLE MAGNESIUM 20 MG PO TBEC: 20.0000 mg | DELAYED_RELEASE_TABLET | Freq: Every day | ORAL | Status: DC

## 2011-05-09 NOTE — Discharge Instructions (Signed)
Follow up with Dr. Kevan Ny 1-2 weeks

## 2011-05-09 NOTE — Progress Notes (Signed)
     Subjective: Pt without c/o.  Tolerated regular diet for breakfast.  No further pain.  Objective: Vital signs in last 24 hours: Temp:  [97.7 F (36.5 C)-98.3 F (36.8 C)] 98 F (36.7 C) (11/10 0500) Pulse Rate:  [65-69] 69  (11/10 0500) Resp:  [19-20] 20  (11/10 0500) BP: (127-169)/(74-96) 127/86 mmHg (11/10 0500) SpO2:  [97 %-100 %] 98 % (11/10 0500) Weight:  [148 lb 9.6 oz (67.405 kg)] 148 lb 9.6 oz (67.405 kg) (11/09 1230)    Intake/Output from previous day:   Intake/Output this shift:    PE: Abd: soft, NT, ND, +BS  Lab Results:  No results found for this basename: WBC:2,HGB:2,HCT:2,PLT:2 in the last 72 hours BMET  Basename 05/08/11 2033  NA --  K --  CL --  CO2 --  GLUCOSE --  BUN 11  CREATININE 1.01  CALCIUM --   PT/INR No results found for this basename: LABPROT:2,INR:2 in the last 72 hours   Studies/Results: @RISRSLT2 @  Anti-infectives: Anti-infectives    None       Assessment/Plan  1. Abdominal pain, resolved  Plan: agree with primary team's note.  Pt tolerated regular diet, suspect he is ok for d/c per primary Call as needed.   LOS: 1 day    Odaliz Mcqueary E 05/09/2011

## 2011-05-09 NOTE — Progress Notes (Signed)
Getting ready to go home - no pain abd seems soft.  Pain of ? Etiology - resolved.   Will see again PRN

## 2011-05-09 NOTE — Progress Notes (Signed)
Subjective: Patient doing well without complaint, no abdominal pain, no nausea no vomiting. Ultrasound reviewed, CAT scan reviewed, no pathology found.  Objective: Vital signs in last 24 hours: Temp:  [97.7 F (36.5 C)-98.3 F (36.8 C)] 98 F (36.7 C) (11/10 0500) Pulse Rate:  [65-69] 69  (11/10 0500) Resp:  [19-20] 20  (11/10 0500) BP: (127-169)/(74-96) 127/86 mmHg (11/10 0500) SpO2:  [97 %-100 %] 98 % (11/10 0500) Weight:  [67.405 kg (148 lb 9.6 oz)] 148 lb 9.6 oz (67.405 kg) (11/09 1230) Weight change:     Intake/Output from previous day:   Intake/Output this shift:  HEENT exam unremarkable Chest clear without rales or rhonchi Cardiovascular regular S1-S2 Abdomen soft nontender   Lab Results: No results found for this basename: WBC:2,HGB:2,HCT:2,PLT:2 in the last 72 hours BMET  Basename 05/08/11 2033  NA --  K --  CL --  CO2 --  GLUCOSE --  BUN 11  CREATININE 1.01  CALCIUM --    Studies/Results: US Abdomen Complete  05/07/2011  *RADIOLOGY REPORT*  Clinical Data:  Epigastric pain, evaluate for gallstones  COMPLETE ABDOMINAL ULTRASOUND  Comparison:  None.  Findings:  Gallbladder:  No gallstones, gallbladder wall thickening, or pericholecystic fluid.  Negative sonographic Murphy's sign.  Common bile duct:  Measures 4 mm.  Liver:  Small hepatic cysts, the largest measuring 1.9 x 2.0 x 2.1 cm in the left hepatic lobe.  Within normal limits in parenchymal echogenicity.  IVC:  Appears normal.  Pancreas:  Poorly visualized due to overlying bowel gas.  Spleen:  Measures 9.9 cm.  Right Kidney:  Measures 10.6 cm.  No mass or hydronephrosis.  Left Kidney:  Measures 10.3 cm.  Suspected mild hydronephrosis.  Abdominal aorta:  No aneurysm identified.  Atherosclerosis.  IMPRESSION: Normal sonographic appearance the gallbladder.  Suspected mild left hydronephrosis.  If there is clinical concern for ureteral calculus, CT abdomen/pelvis without contrast is suggested.  Findings discussed  with and acknowledged by Dr. Marden Noble on 05/07/2011 at 1435 hours.  Original Report Authenticated By: Charline Bills, M.D.   Ct Abdomen Pelvis W Contrast  05/08/2011  *RADIOLOGY REPORT*  Clinical Data: Abdominal pain.  CT ABDOMEN AND PELVIS WITH CONTRAST  Technique:  Multidetector CT imaging of the abdomen and pelvis was performed following the standard protocol during bolus administration of intravenous contrast.  Contrast: 80mL OMNIPAQUE IOHEXOL 300 MG/ML IV SOLN  Comparison: None  Findings: The lung bases are clear.  No pleural effusions.  There is diffuse fatty infiltration of the liver with multiple low attenuation liver lesions consistent with benign cysts.  No worrisome hepatic lesions or intrahepatic biliary dilatation of the gallbladder appears normal.  No common bile duct dilatation.  The pancreas is unremarkable.  The spleen is normal in size.  No focal lesions.  The adrenal glands and kidneys are unremarkable. 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  The stomach, duodenum, small bowel and colon demonstrate no significant abnormalities.  The appendix is normal.  There is diverticulosis of the sigmoid colon without findings for acute diverticulitis.  No mesenteric or retroperitoneal masses or lymphadenopathy.  The aorta is normal in caliber.  No significant atherosclerotic changes.  The major branch vessels are normal.  The bladder, prostate gland and seminal vesicles are unremarkable. Mild prostate gland enlargement.  No pelvic mass or adenopathy.  No free pelvic fluid collections.  No inguinal mass or adenopathy. The bony pelvis is intact.  Lumbar fusion hardware is noted.  No complicating features.  IMPRESSION:  1.  No acute abdominal/pelvic findings, mass lesions or lymphadenopathy. 2.  Diffuse fatty infiltration of the liver and multiple benign hepatic cysts.  Original Report Authenticated By: P. Loralie Champagne, M.D.    Medications:  Scheduled:   . pantoprazole (PROTONIX) IV  40 mg  Intravenous Q12H    Assessment/Plan  Patient's abdominal pain is resolved, CT without any pathology, we will decrease IV fluids, continue PPI especially with his history of NSAID use. We will start a regular diet. If tolerated and no problems on the likely will clear for discharge to home.     LOS: 1 day   Koriana Stepien D 05/09/2011, 11:12 AM

## 2011-05-27 NOTE — Discharge Summary (Signed)
Physician Discharge Summary  Patient ID: Arthur Brady MRN: [id number] DOB/AGE: 69-11-1940 69 y.o.  Admit date: 05/08/2011 Discharge date: 05/27/2011  Admission Diagnoses:Abdominal pain, abnormal ultrasound suggestive hydronephrosis  Discharge Diagnoses: Result abdominal pain questionable etiology, CT without pathology, seen in consultation by surgery no further recommendations Active Problems:  * No active hospital problems. *    Discharged Condition: good  Hospital Course: Patient was admitted to the hospital with complaint of abdominal pain, ultrasound done outpatient suggested hydronephrosis. Patient was admitted further studies were obtained including CT of abdomen and pelvis. Screening labs were unremarkable for etiology, CT of abdomen and pelvis was unremarkable for any pathology including no signs of hydronephrosis. Patient was seen in consultation by general surgery, no further recommendations were made. Patient's pain resolved, it was suspected he may have had a component of some gastritis, especially with his history of end-stage use. He was encouraged to avoid NSAIDs, PPI was recommended, with outpatient followup with his primary M.D. In one to 2 weeks  Consults: General surgery  Significant Diagnostic Studies: {As discussed above  Treatments: {Patient received conservative treatment including n.p.o. And IV fluids.  Discharge Exam: Blood pressure 116/67, pulse 66, temperature 98.6 F (37 C), temperature source Oral, resp. rate 16, height 5\' 7"  (1.702 m), weight 67.405 kg (148 lb 9.6 oz), SpO2 98.00%. General appearance: alert Resp: clear to auscultation bilaterally Cardio: S1, S2 normal GI: soft, non-tender; bowel sounds normal; no masses,  no organomegaly Extremities: extremities normal, atraumatic, no cyanosis or edema  Disposition: Home or Self Care  Discharge Orders    Future Orders Please Complete By Expires   Diet - low sodium heart healthy      Increase  activity slowly        Discharge Medication List as of 05/09/2011  4:40 PM    START taking these medications   Details  omeprazole (PRILOSEC OTC) 20 MG tablet Take 1 tablet (20 mg total) by mouth daily., Starting 05/09/2011, Until Sun 05/08/12, Print      STOP taking these medications     ibuprofen (ADVIL,MOTRIN) 200 MG tablet          Signed: Ranbir Chew D 05/27/2011, 12:59 PM

## 2012-04-11 ENCOUNTER — Emergency Department (HOSPITAL_COMMUNITY): Payer: Medicare Other

## 2012-04-11 ENCOUNTER — Emergency Department (HOSPITAL_COMMUNITY)
Admission: EM | Admit: 2012-04-11 | Discharge: 2012-04-11 | Disposition: A | Discharge status: Home / Self Care | Payer: Medicare Other | Attending: Emergency Medicine | Admitting: Emergency Medicine

## 2012-04-11 ENCOUNTER — Encounter (HOSPITAL_COMMUNITY): Payer: Self-pay | Admitting: *Deleted

## 2012-04-11 DIAGNOSIS — G8190 Hemiplegia, unspecified affecting unspecified side: Secondary | ICD-10-CM

## 2012-04-11 DIAGNOSIS — R21 Rash and other nonspecific skin eruption: Secondary | ICD-10-CM

## 2012-04-11 DIAGNOSIS — N189 Chronic kidney disease, unspecified: Secondary | ICD-10-CM | POA: Insufficient documentation

## 2012-04-11 DIAGNOSIS — H5710 Ocular pain, unspecified eye: Secondary | ICD-10-CM

## 2012-04-11 DIAGNOSIS — R51 Headache: Secondary | ICD-10-CM | POA: Insufficient documentation

## 2012-04-11 LAB — DIFFERENTIAL
Basophils Relative: 0 % (ref 0–1)
Eosinophils Absolute: 0 10*3/uL (ref 0.0–0.7)
Lymphs Abs: 1.7 10*3/uL (ref 0.7–4.0)
Monocytes Absolute: 0.5 10*3/uL (ref 0.1–1.0)
Monocytes Relative: 6 % (ref 3–12)
Neutro Abs: 5.8 10*3/uL (ref 1.7–7.7)
Neutrophils Relative %: 72 % (ref 43–77)

## 2012-04-11 LAB — CSF CELL COUNT WITH DIFFERENTIAL
Eosinophils, CSF: 0 % (ref 0–1)
RBC Count, CSF: 20 /mm3 — ABNORMAL HIGH
Tube #: 1
WBC, CSF: 1 /mm3 (ref 0–5)
WBC, CSF: 1 /mm3 (ref 0–5)

## 2012-04-11 LAB — POCT I-STAT, CHEM 8
BUN: 9 mg/dL (ref 6–23)
Creatinine, Ser: 1 mg/dL (ref 0.50–1.35)
Potassium: 3.9 mEq/L (ref 3.5–5.1)
Sodium: 141 mEq/L (ref 135–145)
TCO2: 24 mmol/L (ref 0–100)

## 2012-04-11 LAB — COMPREHENSIVE METABOLIC PANEL
Albumin: 3.6 g/dL (ref 3.5–5.2)
Alkaline Phosphatase: 47 U/L (ref 39–117)
BUN: 10 mg/dL (ref 6–23)
Chloride: 104 mEq/L (ref 96–112)
Creatinine, Ser: 0.92 mg/dL (ref 0.50–1.35)
GFR calc Af Amer: >90 mL/min (ref >90)
Glucose, Bld: 102 mg/dL — ABNORMAL HIGH (ref 70–99)
Potassium: 3.7 mEq/L (ref 3.5–5.1)
Total Bilirubin: 0.5 mg/dL (ref 0.3–1.2)

## 2012-04-11 LAB — URINALYSIS, ROUTINE W REFLEX MICROSCOPIC
Glucose, UA: NEGATIVE mg/dL
Hgb urine dipstick: NEGATIVE
Leukocytes, UA: NEGATIVE
Protein, ur: NEGATIVE mg/dL
Specific Gravity, Urine: 1.007 (ref 1.005–1.030)
Urobilinogen, UA: 0.2 mg/dL (ref 0.0–1.0)

## 2012-04-11 LAB — GRAM STAIN

## 2012-04-11 LAB — CBC
HCT: 45.1 % (ref 39.0–52.0)
Hemoglobin: 15.1 g/dL (ref 13.0–17.0)
MCH: 30.1 pg (ref 26.0–34.0)
MCHC: 33.5 g/dL (ref 30.0–36.0)
RBC: 5.02 MIL/uL (ref 4.22–5.81)

## 2012-04-11 LAB — POCT I-STAT TROPONIN I

## 2012-04-11 LAB — GLUCOSE, CAPILLARY: Glucose-Capillary: 111 mg/dL — ABNORMAL HIGH (ref 70–99)

## 2012-04-11 LAB — TROPONIN I: Troponin I: <0.3 ng/mL (ref <0.30)

## 2012-04-11 MED ORDER — FLUORESCEIN SODIUM 1 MG OP STRP: 1.0000 | ORAL_STRIP | Freq: Once | OPHTHALMIC | Status: DC

## 2012-04-11 MED ORDER — FLUORESCEIN SODIUM 1 MG OP STRP
ORAL_STRIP | OPHTHALMIC | Status: AC
  Filled 2012-04-11: qty 1

## 2012-04-11 MED ORDER — TETRACAINE HCL 0.5 % OP SOLN
2.0000 [drp] | Freq: Once | OPHTHALMIC | Status: AC
  Administered 2012-04-11: 2 [drp] via OPHTHALMIC
  Filled 2012-04-11: qty 2

## 2012-04-11 MED ORDER — VALACYCLOVIR HCL 1 G PO TABS: 1000.0000 mg | ORAL_TABLET | Freq: Two times a day (BID) | ORAL | Status: AC

## 2012-04-11 NOTE — ED Notes (Signed)
Pt instructed to remain supine until otherwise notified; verbalized understanding - pt given urinal - instructed to roll to side to void versus standing up

## 2012-04-11 NOTE — Consult Note (Addendum)
[facility] STROKE CONSULT NOTE       Chief Complaint: Left facial pain/numbness and left leg weakness   HPI:    Arthur Brady is an 71 y.o. male Woke up this AM at his baseline.  HE was washing his face this am when he noted sever sharp pain in left V1 distribution and then noted some red bumps on his forehead.  He had transient left eye pain which seemed to dissipate.  He and his wife went to [employer] [facility] [employer] where he bent down and had sever eye discomfort.  Wife noted he was not acting himself.  She brought him to Dr. Kevan Ny where he then stated he had left facial numbness.  He was brought to hospital by car to [facility] hospital where he felt his left leg was slightly weaker.  By time CT head was obtained his left leg strength improved. At that time it was a 4-/5 Within 10 minutes his left arm and leg were 5/5. He currently is having left eye pain and some left arm dysmetria.   LSN: 9:15 am tPA Given: No: rapid improvment    Past Medical History  Diagnosis Date  . Abdominal pain   . DJD (degenerative joint disease) of knee   . Pulmonary embolism, iatrogenic   . Lumbar degenerative disc disease   . NSAID long-term use   . Malignant hyperthermia   . Shortness of breath   . Chronic kidney disease     has had kidney stones  . GERD (gastroesophageal reflux disease)     Past Surgical History  Procedure Date  . Back surgery   . Arthroscopy of knee left     No family history on file. Social History:  reports that he quit smoking about 46 years ago. His smoking use included Cigarettes. He has a 24 pack-year smoking history. He does not have any smokeless tobacco history on file. He reports that he drinks about .6 ounces of alcohol per week. He reports that he does not use illicit drugs.  Allergies:  Allergies  Allergen Reactions  . Codeine Phosphate     Agitation and hallucinations  . Morphine And Related     Severe hallucinations and agitation  .  Penicillins     Severe reaction as a child  . Tetanus Toxoids     Severe reaction  . Voltaren (Diclofenac Sodium)     Abdominal pain and nausea    Medications:    No home medication  ROS: History obtained from the patient  General ROS: negative for - chills, fatigue, fever, night sweats, weight gain or weight loss Psychological ROS: negative for - behavioral disorder, hallucinations, memory difficulties, mood swings or suicidal ideation Ophthalmic ROS: negative for - blurry vision, double vision, eye pain or loss of vision ENT ROS: negative for - epistaxis, nasal discharge, oral lesions, sore throat, tinnitus or vertigo Allergy and Immunology ROS: negative for - hives or itchy/watery eyes Hematological and Lymphatic ROS: negative for - bleeding problems, bruising or swollen lymph nodes Endocrine ROS: negative for - galactorrhea, hair pattern changes, polydipsia/polyuria or temperature intolerance Respiratory ROS: negative for - cough, hemoptysis, shortness of breath or wheezing Cardiovascular ROS: negative for - chest pain, dyspnea on exertion, edema or irregular heartbeat Gastrointestinal ROS: negative for - abdominal pain, diarrhea, hematemesis, nausea/vomiting or stool incontinence Genito-Urinary ROS: negative for - dysuria, hematuria, incontinence or urinary frequency/urgency Musculoskeletal ROS: negative for -  joint swelling or muscular weakness Neurological ROS: as noted in HPI Dermatological ROS: negative for rash and skin lesion changes   Physical Examination: Blood pressure 164/95, pulse 74, temperature 98.4 F (36.9 C), temperature source Oral, resp. rate 22, SpO2 100.00%.  Neurologic Examination:   Mental Status: Alert, oriented, thought content appropriate.  Speech fluent without evidence of aphasia.  Able to follow 3 step commands without difficulty. Cranial Nerves: II: Discs flat bilaterally; Visual fields grossly normal, pupils equal, round, reactive to light and  accommodation, photophobia left eye III,IV, VI: ptosis not present, extra-ocular motions intact bilaterally V,VII: smile symmetric, facial light touch sensation normal bilaterally VIII: hearing decreased bilaterally (wears hearing aids) IX,X: gag reflex present XI: bilateral shoulder shrug XII: midline tongue extension Motor: Right : Upper extremity   5/5    Left:     Upper extremity   5/5  Lower extremity   5/5     Lower extremity   5/5 Tone and bulk:normal tone throughout; no atrophy noted Sensory: Pinprick and light touch intact throughout, bilaterally, allodynia left periocular region spreading to left ear region.  Deep Tendon Reflexes: 2+ and symmetric throughout Plantars: Right: downgoing   Left: downgoing Cerebellar: normal finger-to-nose ,  normal heel-to-shin test CV: pulses palpable throughout    Lab Results  Component Value Date/Time   CHOL  Value: 147        ATP III CLASSIFICATION:  <200     mg/dL   Desirable  161-096  mg/dL   Borderline High  >=045    mg/dL   High        4/0/9811  8:10 AM    Results for orders placed during the hospital encounter of 04/11/12 (from the past 48 hour(s))  GLUCOSE, CAPILLARY     Status: Abnormal   Collection Time   04/11/12 11:43 AM      Component Value Range Comment   Glucose-Capillary 111 (*) 70 - 99 mg/dL    Comment 1 Notify RN       Ct Head Wo Contrast  04/11/2012  *RADIOLOGY REPORT*  Clinical Data: Code stroke  CT HEAD WITHOUT CONTRAST  Technique:  Contiguous axial images were obtained from the base of the skull through the vertex without contrast.  Comparison: None.  Findings: Ventricle size is normal.  Age appropriate atrophy. Negative for acute infarct.  Negative for hemorrhage or mass. Calvarium is intact.  IMPRESSION: No acute abnormality.  Critical Value/emergent results were called by telephone at the time of interpretation on 04/11/2012 at 1200 hours to Dr. Bernette Mayers, who verbally acknowledged these results.   Original Report  Authenticated By: Camelia Phenes, M.D.      EKG NSR  Assessment:    71 y.o. male with new onset left eye and V1 distribution pain which also had a component of numbness. Initially on exam with left leg weakness and LUE dysmetria.  The weakness has resolved but the remainder of symptoms continue.  There is concern for an HSV infection.   With the LUE and LLE complaints, CNS invasion also becomes a concern and should be ruled out.    Stroke Risk Factors - none    PLAN: 1. MRI of the brain to evaluate for a possible herpes encephalitis 2. LP with fluid to be sent for herpes PCR. 3. If above unremarkable would treat patient with a protocol consistent for treatment Bell's Palsy     Felicie Morn PA-C [facility] [phone]  04/11/2012, 12:16 PM    Patient seen  and examined.  Clinical course and management discussed.  Necessary edits performed.  I agree with the above.  Case discussed with Dr. Bobbye Riggs, MD [facility] [phone]  04/11/2012  2:37 PM

## 2012-04-11 NOTE — ED Notes (Signed)
PT was at [facility] and was normal and had been lifting bags of rock.  Left face went numb and he has knots to left upper forehead and then went to eye.  Left leg not working right

## 2012-04-11 NOTE — ED Provider Notes (Signed)
History     CSN: [id number]  Arrival date & time 04/11/12  1129   First MD Initiated Contact with Patient 04/11/12 1152      Chief Complaint  Patient presents with  . Code Stroke    (Consider location/radiation/quality/duration/timing/severity/associated sxs/prior treatment) HPI - Level 5 caveat due to need for intervention Pt's wife reports this morning at about 9:15am pt noted a 'knot' on his L forehead. A short while later he noticed a sharp, shooting pain on his L temple. He and wife went to [facility] where he was lifting rocks. After returning home he was not feeling well and went to PCP office where he began to noticed numbness on L face and weakness in L leg. Brought by wife to ED for evaluation of symptoms, made Code Stroke in Triage. Taken directly to CT and then to the room. Pt is complaining of pain in L face.   Past Medical History  Diagnosis Date  . Abdominal pain   . DJD (degenerative joint disease) of knee   . Pulmonary embolism, iatrogenic   . Lumbar degenerative disc disease   . NSAID long-term use   . Malignant hyperthermia   . Shortness of breath   . Chronic kidney disease     has had kidney stones  . GERD (gastroesophageal reflux disease)     Past Surgical History  Procedure Date  . Back surgery   . Arthroscopy of knee left     No family history on file.  History  Substance Use Topics  . Smoking status: Former Smoker -- 3.0 packs/day for 8 years    Types: Cigarettes    Quit date: 05/07/1965  . Smokeless tobacco: Not on file  . Alcohol Use: 0.6 oz/week    1 Glasses of wine per week      Review of Systems Unable to assess due to need for intervention  Allergies  Codeine phosphate; Morphine and related; Penicillins; Tetanus toxoids; and Voltaren  Home Medications   Current Outpatient Rx  Name Route Sig Dispense Refill  . OMEPRAZOLE MAGNESIUM 20 MG PO TBEC Oral Take 1 tablet (20 mg total) by mouth daily. 30 tablet 0    Over the counter  product    BP 178/90  Pulse 74  Temp 97.9 F (36.6 C) (Oral)  Resp 18  SpO2 99%  Physical Exam  Nursing note and vitals reviewed. Constitutional: He appears well-developed and well-nourished.  HENT:  Head: Normocephalic and atraumatic.       L temple tender to palpation, ?evolving shingles rash  Eyes: EOM are normal. Pupils are equal, round, and reactive to light.  Neck: Normal range of motion. Neck supple.  Cardiovascular: Normal rate, normal heart sounds and intact distal pulses.   Pulmonary/Chest: Effort normal and breath sounds normal.  Abdominal: Bowel sounds are normal. He exhibits no distension. There is no tenderness.  Musculoskeletal: Normal range of motion. He exhibits no edema and no tenderness.  Neurological: He is alert. No cranial nerve deficit or sensory deficit.       For full NIH Stroke Scale please see Stroke Team assessment; weakness in L leg  Skin: Skin is warm and dry. No rash noted.  Psychiatric: He has a normal mood and affect.    ED Course  Procedures (including critical care time)  Labs Reviewed  COMPREHENSIVE METABOLIC PANEL - Abnormal; Notable for the following:    Glucose, Bld 102 (*)     GFR calc non Af Amer 83 (*)  All other components within normal limits  GLUCOSE, CAPILLARY - Abnormal; Notable for the following:    Glucose-Capillary 111 (*)     All other components within normal limits  PROTIME-INR  APTT  CBC  DIFFERENTIAL  TROPONIN I  SEDIMENTATION RATE  POCT I-STAT TROPONIN I  POCT I-STAT, CHEM 8  URINALYSIS, ROUTINE W REFLEX MICROSCOPIC  HERPES SIMPLEX VIRUS(HSV) DNA BY PCR  CSF CELL COUNT WITH DIFFERENTIAL  PROTEIN AND GLUCOSE, CSF  CSF CULTURE   Ct Head Wo Contrast  04/11/2012  *RADIOLOGY REPORT*  Clinical Data: Code stroke  CT HEAD WITHOUT CONTRAST  Technique:  Contiguous axial images were obtained from the base of the skull through the vertex without contrast.  Comparison: None.  Findings: Ventricle size is normal.  Age  appropriate atrophy. Negative for acute infarct.  Negative for hemorrhage or mass. Calvarium is intact.  IMPRESSION: No acute abnormality.  Critical Value/emergent results were called by telephone at the time of interpretation on 04/11/2012 at 1200 hours to Dr. Bernette Mayers, who verbally acknowledged these results.   Original Report Authenticated By: Camelia Phenes, M.D.      No diagnosis found.    MDM   Date: 04/11/2012  Rate: 68  Rhythm: normal sinus rhythm  QRS Axis: normal  Intervals: normal  ST/T Wave abnormalities: normal  Conduction Disutrbances: none  Narrative Interpretation: unremarkable   Pt with what appears to be shingles rash developing on forehead. Normal sed rate, Seen by Dr. Thad Ranger who will guide his further ED workup to include evaluation for HSV encephalitis with MRI and LP.        Charles B. Bernette Mayers, MD 04/11/12 [phone]

## 2012-04-11 NOTE — ED Notes (Signed)
Patient transported from xray dept

## 2012-04-11 NOTE — ED Notes (Signed)
LSN:  [id number]

## 2012-04-12 LAB — HERPES SIMPLEX VIRUS(HSV) DNA BY PCR: HSV 1 DNA: NOT DETECTED

## 2012-04-15 LAB — CSF CULTURE W GRAM STAIN: Culture: NO GROWTH

## 2015-01-10 ENCOUNTER — Emergency Department (HOSPITAL_COMMUNITY)
Admission: EM | Admit: 2015-01-10 | Discharge: 2015-01-10 | Disposition: A | Discharge status: Home / Self Care | Payer: Commercial Managed Care - HMO | Attending: Emergency Medicine | Admitting: Emergency Medicine

## 2015-01-10 ENCOUNTER — Encounter (HOSPITAL_COMMUNITY): Payer: Self-pay | Admitting: Emergency Medicine

## 2015-01-10 DIAGNOSIS — Z880 Allergy status to penicillin: Secondary | ICD-10-CM | POA: Insufficient documentation

## 2015-01-10 DIAGNOSIS — K089 Disorder of teeth and supporting structures, unspecified: Secondary | ICD-10-CM | POA: Diagnosis not present

## 2015-01-10 DIAGNOSIS — Z792 Long term (current) use of antibiotics: Secondary | ICD-10-CM | POA: Diagnosis not present

## 2015-01-10 DIAGNOSIS — Z8739 Personal history of other diseases of the musculoskeletal system and connective tissue: Secondary | ICD-10-CM | POA: Insufficient documentation

## 2015-01-10 DIAGNOSIS — Z87442 Personal history of urinary calculi: Secondary | ICD-10-CM | POA: Diagnosis not present

## 2015-01-10 DIAGNOSIS — R42 Dizziness and giddiness: Secondary | ICD-10-CM | POA: Diagnosis not present

## 2015-01-10 DIAGNOSIS — R112 Nausea with vomiting, unspecified: Secondary | ICD-10-CM | POA: Diagnosis not present

## 2015-01-10 DIAGNOSIS — Z8719 Personal history of other diseases of the digestive system: Secondary | ICD-10-CM | POA: Insufficient documentation

## 2015-01-10 DIAGNOSIS — Z791 Long term (current) use of non-steroidal anti-inflammatories (NSAID): Secondary | ICD-10-CM | POA: Insufficient documentation

## 2015-01-10 DIAGNOSIS — R109 Unspecified abdominal pain: Secondary | ICD-10-CM | POA: Insufficient documentation

## 2015-01-10 DIAGNOSIS — Z8639 Personal history of other endocrine, nutritional and metabolic disease: Secondary | ICD-10-CM | POA: Diagnosis not present

## 2015-01-10 DIAGNOSIS — N189 Chronic kidney disease, unspecified: Secondary | ICD-10-CM | POA: Diagnosis not present

## 2015-01-10 DIAGNOSIS — Z87891 Personal history of nicotine dependence: Secondary | ICD-10-CM | POA: Insufficient documentation

## 2015-01-10 DIAGNOSIS — Z86711 Personal history of pulmonary embolism: Secondary | ICD-10-CM | POA: Diagnosis not present

## 2015-01-10 LAB — CBC WITH DIFFERENTIAL/PLATELET
BASOS PCT: 0 % (ref 0–1)
Basophils Absolute: 0 10*3/uL (ref 0.0–0.1)
EOS ABS: 0 10*3/uL (ref 0.0–0.7)
EOS PCT: 0 % (ref 0–5)
HEMATOCRIT: 45.5 % (ref 39.0–52.0)
Hemoglobin: 15.3 g/dL (ref 13.0–17.0)
LYMPHS ABS: 1 10*3/uL (ref 0.7–4.0)
LYMPHS PCT: 10 % — AB (ref 12–46)
MCH: 29.9 pg (ref 26.0–34.0)
MCHC: 33.6 g/dL (ref 30.0–36.0)
MCV: 89 fL (ref 78.0–100.0)
MONOS PCT: 5 % (ref 3–12)
Monocytes Absolute: 0.5 10*3/uL (ref 0.1–1.0)
Neutro Abs: 8.1 10*3/uL — ABNORMAL HIGH (ref 1.7–7.7)
Neutrophils Relative %: 85 % — ABNORMAL HIGH (ref 43–77)
PLATELETS: 190 10*3/uL (ref 150–400)
RBC: 5.11 MIL/uL (ref 4.22–5.81)
RDW: 12.9 % (ref 11.5–15.5)
WBC: 9.6 10*3/uL (ref 4.0–10.5)

## 2015-01-10 LAB — BASIC METABOLIC PANEL
Anion gap: 5 (ref 5–15)
BUN: 13 mg/dL (ref 6–20)
CHLORIDE: 103 mmol/L (ref 101–111)
CO2: 27 mmol/L (ref 22–32)
CREATININE: 0.89 mg/dL (ref 0.61–1.24)
Calcium: 8.9 mg/dL (ref 8.9–10.3)
GFR calc Af Amer: >60 mL/min (ref >60)
GFR calc non Af Amer: >60 mL/min (ref >60)
Glucose, Bld: 116 mg/dL — ABNORMAL HIGH (ref 65–99)
POTASSIUM: 3.9 mmol/L (ref 3.5–5.1)
SODIUM: 135 mmol/L (ref 135–145)

## 2015-01-10 LAB — LIPASE, BLOOD: Lipase: 17 U/L — ABNORMAL LOW (ref 22–51)

## 2015-01-10 MED ORDER — MECLIZINE HCL 25 MG PO TABS: 25.0000 mg | ORAL_TABLET | Freq: Three times a day (TID) | ORAL | Status: AC | PRN

## 2015-01-10 MED ORDER — DIPHENHYDRAMINE HCL 50 MG/ML IJ SOLN
25.0000 mg | Freq: Once | INTRAMUSCULAR | Status: AC
  Administered 2015-01-10: 25 mg via INTRAVENOUS
  Filled 2015-01-10: qty 1

## 2015-01-10 MED ORDER — SODIUM CHLORIDE 0.9 % IV BOLUS (SEPSIS)
1000.0000 mL | Freq: Once | INTRAVENOUS | Status: AC
  Administered 2015-01-10: 1000 mL via INTRAVENOUS

## 2015-01-10 MED ORDER — DIAZEPAM 5 MG PO TABS
5.0000 mg | ORAL_TABLET | Freq: Once | ORAL | Status: DC
  Filled 2015-01-10: qty 1

## 2015-01-10 MED ORDER — METOCLOPRAMIDE HCL 5 MG/ML IJ SOLN
10.0000 mg | Freq: Once | INTRAMUSCULAR | Status: AC
  Administered 2015-01-10: 10 mg via INTRAVENOUS
  Filled 2015-01-10: qty 2

## 2015-01-10 MED ORDER — ONDANSETRON 4 MG PO TBDP: 4.0000 mg | ORAL_TABLET | Freq: Three times a day (TID) | ORAL | Status: DC | PRN

## 2015-01-10 MED ORDER — MECLIZINE HCL 25 MG PO TABS
25.0000 mg | ORAL_TABLET | Freq: Once | ORAL | Status: AC
  Administered 2015-01-10: 25 mg via ORAL
  Filled 2015-01-10: qty 1

## 2015-01-10 MED ORDER — ONDANSETRON HCL 4 MG/2ML IJ SOLN
4.0000 mg | Freq: Once | INTRAMUSCULAR | Status: AC
  Administered 2015-01-10: 4 mg via INTRAVENOUS
  Filled 2015-01-10: qty 2

## 2015-01-10 MED ORDER — LORAZEPAM 2 MG/ML IJ SOLN
1.0000 mg | Freq: Once | INTRAMUSCULAR | Status: AC
  Administered 2015-01-10: 1 mg via INTRAVENOUS
  Filled 2015-01-10: qty 1

## 2015-01-10 NOTE — ED Notes (Signed)
Patient here with complaints of dizziness after having a root canal yesturday. Patient is currently taking maeds, clindamycin and tramadol. Patient also complains of abd pain, nausea.

## 2015-01-10 NOTE — ED Notes (Signed)
Patient voided in bed.  Pt ambulatory to change sheets and clothes.  Patient noted to be unsteady on his feet.  RN remained by side and assisted patient into new gown and clothes.  Wife going home to get patient new clothes.  Patient in view of the nurses station-placed back in bed.  Side rails up x2.  No other needs at present.  Patient resting with eyes closed.

## 2015-01-10 NOTE — ED Notes (Signed)
Bed: TA56 Expected date:  Expected time:  Means of arrival:  Comments: shob,dizzy

## 2015-01-10 NOTE — Discharge Instructions (Signed)
Benign Positional Vertigo Vertigo means you feel like you or your surroundings are moving when they are not. Benign positional vertigo is the most common form of vertigo. Benign means that the cause of your condition is not serious. Benign positional vertigo is more common in older adults. CAUSES  Benign positional vertigo is the result of an upset in the labyrinth system. This is an area in the middle ear that helps control your balance. This may be caused by a viral infection, head injury, or repetitive motion. However, often no specific cause is found. SYMPTOMS  Symptoms of benign positional vertigo occur when you move your head or eyes in different directions. Some of the symptoms may include:  Loss of balance and falls.  Vomiting.  Blurred vision.  Dizziness.  Nausea.  Involuntary eye movements (nystagmus). DIAGNOSIS  Benign positional vertigo is usually diagnosed by physical exam. If the specific cause of your benign positional vertigo is unknown, your caregiver may perform imaging tests, such as magnetic resonance imaging (MRI) or computed tomography (CT). TREATMENT  Your caregiver may recommend movements or procedures to correct the benign positional vertigo. Medicines such as meclizine, benzodiazepines, and medicines for nausea may be used to treat your symptoms. In rare cases, if your symptoms are caused by certain conditions that affect the inner ear, you may need surgery. HOME CARE INSTRUCTIONS   Follow your caregiver's instructions.  Move slowly. Do not make sudden body or head movements.  Avoid driving.  Avoid operating heavy machinery.  Avoid performing any tasks that would be dangerous to you or others during a vertigo episode.  Drink enough fluids to keep your urine clear or pale yellow. SEEK IMMEDIATE MEDICAL CARE IF:   You develop problems with walking, weakness, numbness, or using your arms, hands, or legs.  You have difficulty speaking.  You develop  severe headaches.  Your nausea or vomiting continues or gets worse.  You develop visual changes.  Your family or friends notice any behavioral changes.  Your condition gets worse.  You have a fever.  You develop a stiff neck or sensitivity to light. MAKE SURE YOU:   Understand these instructions.  Will watch your condition.  Will get help right away if you are not doing well or get worse. Document Released: 03/23/2006 Document Revised: 09/07/2011 Document Reviewed: 03/05/2011 [employer] Patient Information 2015 [employer]. This information is not intended to replace advice given to you by your health care provider. Make sure you discuss any questions you have with your health care provider.    

## 2015-01-10 NOTE — ED Provider Notes (Signed)
CSN: [id number]     Arrival date & time 01/10/15  1514 History   First MD Initiated Contact with Patient 01/10/15 1553     Chief Complaint  Patient presents with  . Dizziness  . Abdominal Pain  . Nausea  . Shortness of Breath   Arthur Brady is a 74 y.o. male who presents to the ED complaining of room spinning dizziness since last night. The patient reports he has a history of vertigo, but reports this feels more dizzy, but not different. He reports he had a root canal yesterday by Doctor Remonia Richter. He reports he has started taking tramadol and Azithromycin yesterday. He reports having left upper abdominal cramping and nausea since yesterday. No vomiting or diarrhea. He reports he has been drinking water and soup. He complains of room spinning dizziness that is worse with position change and with movement of his head. The patient denies diarrhea, rashes, ear pain, double vision, numbness, tingling, weakness, vomiting, chest pain, shortness of breath or palpitations.   (Consider location/radiation/quality/duration/timing/severity/associated sxs/prior Treatment) HPI  Past Medical History  Diagnosis Date  . Abdominal pain   . DJD (degenerative joint disease) of knee   . Pulmonary embolism, iatrogenic   . Lumbar degenerative disc disease   . NSAID long-term use   . Malignant hyperthermia   . Shortness of breath   . Chronic kidney disease     has had kidney stones  . GERD (gastroesophageal reflux disease)    Past Surgical History  Procedure Laterality Date  . Back surgery    . Arthroscopy of knee left     History reviewed. No pertinent family history. History  Substance Use Topics  . Smoking status: Former Smoker -- 3.00 packs/day for 8 years    Types: Cigarettes    Quit date: 05/07/1965  . Smokeless tobacco: Not on file  . Alcohol Use: 0.6 oz/week    1 Glasses of wine per week    Review of Systems  Constitutional: Negative for fever and chills.  HENT: Positive for dental  problem. Negative for congestion, ear pain, facial swelling, hearing loss, mouth sores, rhinorrhea, sore throat, tinnitus and trouble swallowing.   Eyes: Negative for pain and visual disturbance.  Respiratory: Negative for cough, shortness of breath and wheezing.   Cardiovascular: Negative for chest pain and palpitations.  Gastrointestinal: Positive for nausea and abdominal pain. Negative for vomiting and diarrhea.  Genitourinary: Negative for dysuria, urgency, frequency and hematuria.  Musculoskeletal: Negative for back pain and neck pain.  Skin: Negative for rash.  Neurological: Positive for dizziness. Negative for syncope, speech difficulty, weakness, light-headedness, numbness and headaches.      Allergies  Codeine phosphate; Morphine and related; Penicillins; Tetanus toxoids; and Voltaren  Home Medications   Prior to Admission medications   Medication Sig Start Date End Date Taking? Authorizing Provider  azithromycin (ZITHROMAX) 500 MG tablet Take 500 mg by mouth daily. 01/09/15  Yes Historical Provider, MD  meclizine (ANTIVERT) 25 MG tablet Take 1 tablet (25 mg total) by mouth 3 (three) times daily as needed for dizziness. 01/10/15   Waynetta Pean, PA-C  ondansetron (ZOFRAN ODT) 4 MG disintegrating tablet Take 1 tablet (4 mg total) by mouth every 8 (eight) hours as needed for nausea or vomiting. 01/10/15   Waynetta Pean, PA-C  traMADol-acetaminophen (ULTRACET) 37.5-325 MG per tablet Take 2 tablets by mouth every 6 (six) hours as needed for moderate pain or severe pain.  01/02/15  Yes Historical Provider, MD   BP 172/92 mmHg  Pulse 74  Temp(Src) 97.4 F (36.3 C) (Oral)  Resp 18  SpO2 100% Physical Exam  Constitutional: He is oriented to person, place, and time. He appears well-developed and well-nourished. No distress.  Nontoxic-appearing.  HENT:  Head: Normocephalic and atraumatic.  Right Ear: External ear normal.  Left Ear: External ear normal.  Mouth/Throat: Oropharynx is  clear and moist. No oropharyngeal exudate.  Mild tenderness over his right lower molar from root canal. Bilateral tympanic membranes are pearly-gray without erythema or loss of landmarks. Uvula is midline without edema. No posterior oropharyngeal erythema or edema. Tongue protrusion is normal. No tonsillar hypertrophy or exudates.  Eyes: Conjunctivae and EOM are normal. Pupils are equal, round, and reactive to light. Right eye exhibits no discharge. Left eye exhibits no discharge.  Neck: Normal range of motion. Neck supple. No JVD present. No tracheal deviation present.  Cardiovascular: Normal rate, regular rhythm, normal heart sounds and intact distal pulses.  Exam reveals no gallop and no friction rub.   No murmur heard. Pulmonary/Chest: Effort normal and breath sounds normal. No respiratory distress. He has no wheezes. He has no rales.  Abdominal: Soft. Bowel sounds are normal. He exhibits no distension. There is no tenderness. There is no guarding.  Abdomen is soft and nontender to palpation.  Musculoskeletal: He exhibits no edema.  Patient is spontaneously moving all extremities in a coordinated fashion exhibiting good strength.   Lymphadenopathy:    He has no cervical adenopathy.  Neurological: He is alert and oriented to person, place, and time. No cranial nerve deficit. Coordination normal.  Patient is altert and oriented 3. Cranial nerves are intact. Sensation intact his bilateral upper and lower extremities. Patient has 5 out of 5 strength in his bilateral upper and lower extremities.  Skin: Skin is warm and dry. No rash noted. He is not diaphoretic. No erythema. No pallor.  Psychiatric: He has a normal mood and affect. His behavior is normal.  Nursing note and vitals reviewed.   ED Course  Procedures (including critical care time) Labs Review Labs Reviewed  BASIC METABOLIC PANEL - Abnormal; Notable for the following:    Glucose, Bld 116 (*)    All other components within normal  limits  CBC WITH DIFFERENTIAL/PLATELET - Abnormal; Notable for the following:    Neutrophils Relative % 85 (*)    Neutro Abs 8.1 (*)    Lymphocytes Relative 10 (*)    All other components within normal limits  LIPASE, BLOOD - Abnormal; Notable for the following:    Lipase 17 (*)    All other components within normal limits    Imaging Review No results found.   EKG Interpretation   Date/Time:  Thursday January 10 2015 16:44:54 EDT Ventricular Rate:  60 PR Interval:  199 QRS Duration: 96 QT Interval:  392 QTC Calculation: 392 R Axis:   23 Text Interpretation:  Sinus rhythm Minimal ST elevation, anterior leads No  significant change was found Confirmed by CAMPOS  MD, KEVIN ([id number]) on  01/10/2015 4:51:14 PM      Filed Vitals:   01/10/15 1520 01/10/15 1826 01/10/15 2004 01/10/15 2147  BP: 167/68 152/82 171/90 172/92  Pulse: 61 63 68 74  Temp: 97.4 F (36.3 C)     TempSrc: Oral     Resp: 20 15 22 18   SpO2: 100% 98% 98% 100%     MDM   Meds given in ED:  Medications  diazepam (VALIUM) tablet 5 mg (5 mg Oral Not Given 01/10/15 1725)  sodium chloride 0.9 % bolus 1,000 mL (0 mLs Intravenous Stopped 01/10/15 1919)  ondansetron (ZOFRAN) injection 4 mg (4 mg Intravenous Given 01/10/15 1646)  meclizine (ANTIVERT) tablet 25 mg (25 mg Oral Given 01/10/15 1646)  metoCLOPramide (REGLAN) injection 10 mg (10 mg Intravenous Given 01/10/15 1823)  diphenhydrAMINE (BENADRYL) injection 25 mg (25 mg Intravenous Given 01/10/15 1823)  LORazepam (ATIVAN) injection 1 mg (1 mg Intravenous Given 01/10/15 1823)    Discharge Medication List as of 01/10/2015  9:37 PM    START taking these medications   Details  meclizine (ANTIVERT) 25 MG tablet Take 1 tablet (25 mg total) by mouth 3 (three) times daily as needed for dizziness., Starting 01/10/2015, Until Discontinued, Print    ondansetron (ZOFRAN ODT) 4 MG disintegrating tablet Take 1 tablet (4 mg total) by mouth every 8 (eight) hours as needed for nausea  or vomiting., Starting 01/10/2015, Until Discontinued, Print        Final diagnoses:  Vertigo  Non-intractable vomiting with nausea, vomiting of unspecified type    This is a 74 y.o. male who presents to the ED complaining of room spinning dizziness since last night. The patient reports he has a history of vertigo, but reports this feels more dizzy, but not different. He reports he had a root canal yesterday by Doctor Remonia Richter. He reports he has started taking tramadol and Azithromycin yesterday. He reports his symptoms are worse with position change or movement of his head. He describes this as room spinning dizziness. On exam the patient is afebrile and nontoxic appearing. He has no focal neurological deficits. EKG is unchanged from his previous. He has worsening room spinning dizziness with sitting up. Patient given meclizine and Valium which she vomited up initially. Patient was then given Reglan, ativan and Benadryl with the fluid bolus which he tolerated well. At reevaluation he reports his vertigo has completely resolved and he feels back to normal. He is able to ambulate in the room without difficulty or. He is tolerated 2 glasses of ginger ale without difficulty or vomiting. He reports feeling ready for discharge. We'll discharge with prescriptions for meclizine and Zofran and have him follow-up with his PCP. Also advised to follow-up with his dental provider. I advised the patient to follow-up with their primary care provider this week. I advised the patient to return to the emergency department with new or worsening symptoms or new concerns. The patient verbalized understanding and agreement with plan.    This patient was discussed with Dr. Venora Maples who agrees with assessment and plan.    Waynetta Pean, PA-C 01/11/15 [place], MD 01/15/15 [phone]

## 2018-08-08 DIAGNOSIS — M179 Osteoarthritis of knee, unspecified: Secondary | ICD-10-CM | POA: Diagnosis not present

## 2018-08-08 DIAGNOSIS — Z1389 Encounter for screening for other disorder: Secondary | ICD-10-CM | POA: Diagnosis not present

## 2018-08-08 DIAGNOSIS — K219 Gastro-esophageal reflux disease without esophagitis: Secondary | ICD-10-CM | POA: Diagnosis not present

## 2018-08-08 DIAGNOSIS — Z0000 Encounter for general adult medical examination without abnormal findings: Secondary | ICD-10-CM | POA: Diagnosis not present

## 2018-12-16 DIAGNOSIS — M25561 Pain in right knee: Secondary | ICD-10-CM | POA: Diagnosis not present

## 2018-12-23 DIAGNOSIS — M25561 Pain in right knee: Secondary | ICD-10-CM | POA: Diagnosis not present

## 2019-02-08 DIAGNOSIS — H903 Sensorineural hearing loss, bilateral: Secondary | ICD-10-CM | POA: Diagnosis not present

## 2019-03-21 ENCOUNTER — Other Ambulatory Visit: Payer: Self-pay

## 2019-03-21 ENCOUNTER — Emergency Department (HOSPITAL_COMMUNITY): Payer: Medicare Other

## 2019-03-21 ENCOUNTER — Emergency Department (HOSPITAL_COMMUNITY)
Admission: EM | Admit: 2019-03-21 | Discharge: 2019-03-22 | Disposition: A | Discharge status: Home / Self Care | Payer: Medicare Other | Attending: Emergency Medicine | Admitting: Emergency Medicine

## 2019-03-21 ENCOUNTER — Encounter (HOSPITAL_COMMUNITY): Payer: Self-pay | Admitting: Emergency Medicine

## 2019-03-21 DIAGNOSIS — Z87891 Personal history of nicotine dependence: Secondary | ICD-10-CM | POA: Insufficient documentation

## 2019-03-21 DIAGNOSIS — M549 Dorsalgia, unspecified: Secondary | ICD-10-CM | POA: Diagnosis not present

## 2019-03-21 DIAGNOSIS — N189 Chronic kidney disease, unspecified: Secondary | ICD-10-CM | POA: Insufficient documentation

## 2019-03-21 DIAGNOSIS — R339 Retention of urine, unspecified: Secondary | ICD-10-CM

## 2019-03-21 DIAGNOSIS — I10 Essential (primary) hypertension: Secondary | ICD-10-CM | POA: Diagnosis not present

## 2019-03-21 DIAGNOSIS — R55 Syncope and collapse: Secondary | ICD-10-CM | POA: Insufficient documentation

## 2019-03-21 DIAGNOSIS — R531 Weakness: Secondary | ICD-10-CM | POA: Diagnosis not present

## 2019-03-21 DIAGNOSIS — M542 Cervicalgia: Secondary | ICD-10-CM | POA: Diagnosis not present

## 2019-03-21 LAB — BASIC METABOLIC PANEL
Anion gap: 10 (ref 5–15)
BUN: 12 mg/dL (ref 8–23)
CO2: 23 mmol/L (ref 22–32)
Calcium: 9.3 mg/dL (ref 8.9–10.3)
Chloride: 109 mmol/L (ref 98–111)
Creatinine, Ser: 1.01 mg/dL (ref 0.61–1.24)
GFR calc Af Amer: >60 mL/min (ref >60)
GFR calc non Af Amer: >60 mL/min (ref >60)
Glucose, Bld: 142 mg/dL — ABNORMAL HIGH (ref 70–99)
Potassium: 3.4 mmol/L — ABNORMAL LOW (ref 3.5–5.1)
Sodium: 142 mmol/L (ref 135–145)

## 2019-03-21 LAB — URINALYSIS, ROUTINE W REFLEX MICROSCOPIC
Bilirubin Urine: NEGATIVE
Glucose, UA: NEGATIVE mg/dL
Hgb urine dipstick: NEGATIVE
Ketones, ur: NEGATIVE mg/dL
Leukocytes,Ua: NEGATIVE
Nitrite: NEGATIVE
Protein, ur: NEGATIVE mg/dL
Specific Gravity, Urine: 1.001 — ABNORMAL LOW (ref 1.005–1.030)
pH: 7 (ref 5.0–8.0)

## 2019-03-21 LAB — CBC
HCT: 49.9 % (ref 39.0–52.0)
Hemoglobin: 16.5 g/dL (ref 13.0–17.0)
MCH: 31 pg (ref 26.0–34.0)
MCHC: 33.1 g/dL (ref 30.0–36.0)
MCV: 93.6 fL (ref 80.0–100.0)
Platelets: 185 10*3/uL (ref 150–400)
RBC: 5.33 MIL/uL (ref 4.22–5.81)
RDW: 13 % (ref 11.5–15.5)
WBC: 7.1 10*3/uL (ref 4.0–10.5)
nRBC: 0 % (ref 0.0–0.2)

## 2019-03-21 LAB — CBG MONITORING, ED: Glucose-Capillary: 129 mg/dL — ABNORMAL HIGH (ref 70–99)

## 2019-03-21 MED ORDER — SODIUM CHLORIDE 0.9% FLUSH
3.0000 mL | Freq: Once | INTRAVENOUS | Status: AC
  Administered 2019-03-21: 3 mL via INTRAVENOUS

## 2019-03-21 NOTE — ED Provider Notes (Addendum)
[facility] DEPT Provider Note   CSN: [id number] Arrival date & time: 03/21/19  2135     History   Chief Complaint Chief Complaint  Patient presents with   Near Syncope   Urinary Retention    HPI Arthur Brady is a 78 y.o. male.     Patient presents to the emergency department for evaluation of near syncope.  Patient reports that he has noticed that he has had decreased urine output through the course of today.  He also has been experiencing neck pain.  His neck pain is on the lateral aspect of the neck bilaterally and is a chronic problem for him.  He is currently renovating a new house and has been doing a lot of work and the pain has worsened to because of this, but is not any more severe than he has had problems in the past.  No posterior pain.  No radiation to the arms, no numbness tingling or weakness of the upper extremities.  While eating dinner he was having increased sensation of needing to urinate with bladder pain.  He had an episode where he felt like he was going to pass out.  He reports that he could hear his wife calling his name and talking to him but he could not respond.  He did not completely pass out.  It is unknown how long this lasted but it was brief.  He is now back to his normal baseline.  Patient does report chronic problems with urination.  He has been experiencing low urine stream with need to get up at night frequently to urinate and sensation of not completely emptying his bladder.     Past Medical History:  Diagnosis Date   Abdominal pain    Chronic kidney disease    has had kidney stones   DJD (degenerative joint disease) of knee    GERD (gastroesophageal reflux disease)    Lumbar degenerative disc disease    Malignant hyperthermia    NSAID long-term use    Pulmonary embolism, iatrogenic ([place])    Shortness of breath     Patient Active Problem List   Diagnosis Date Noted   Hemiplegia, unspecified,  affecting dominant side 04/11/2012   Eye pain 04/11/2012   Rash 04/11/2012   Abdominal pain 05/08/2011   Hydronephrosis, left 05/08/2011   NSAID long-term use     Past Surgical History:  Procedure Laterality Date   arthroscopy of knee left     BACK SURGERY          Home Medications    Prior to Admission medications   Medication Sig Start Date End Date Taking? Authorizing Provider  meclizine (ANTIVERT) 25 MG tablet Take 1 tablet (25 mg total) by mouth 3 (three) times daily as needed for dizziness. Patient not taking: Reported on 03/21/2019 01/10/15   Waynetta Pean, PA-C  ondansetron (ZOFRAN ODT) 4 MG disintegrating tablet Take 1 tablet (4 mg total) by mouth every 8 (eight) hours as needed for nausea or vomiting. Patient not taking: Reported on 03/21/2019 01/10/15   Waynetta Pean, PA-C    Family History History reviewed. No pertinent family history.  Social History Social History   Tobacco Use   Smoking status: Former Smoker    Packs/day: 3.00    Years: 8.00    Pack years: 24.00    Types: Cigarettes    Quit date: 05/07/1965    Years since quitting: 53.9   Smokeless tobacco: Never Used  Substance Use Topics  Alcohol use: Yes    Alcohol/week: 1.0 standard drinks    Types: 1 Glasses of wine per week   Drug use: No     Allergies   Codeine phosphate, Morphine and related, Penicillins, Tetanus toxoids, and Voltaren [diclofenac sodium]   Review of Systems Review of Systems  Genitourinary: Positive for decreased urine volume.  Musculoskeletal: Positive for neck pain.  All other systems reviewed and are negative.    Physical Exam Updated Vital Signs BP 126/76    Pulse 71    Temp 98 F (36.7 C) (Oral)    Resp 14    Ht 5\' 5"  (1.651 m)    Wt 63.5 kg    SpO2 98%    BMI 23.30 kg/m   Physical Exam Vitals signs and nursing note reviewed.  Constitutional:      General: He is not in acute distress.    Appearance: Normal appearance. He is well-developed.    HENT:     Head: Normocephalic and atraumatic.     Right Ear: Hearing normal.     Left Ear: Hearing normal.     Nose: Nose normal.  Eyes:     Conjunctiva/sclera: Conjunctivae normal.     Pupils: Pupils are equal, round, and reactive to light.  Neck:     Musculoskeletal: Normal range of motion and neck supple. Normal range of motion. Pain with movement (lateral extremes) and muscular tenderness present.   Cardiovascular:     Rate and Rhythm: Regular rhythm.     Heart sounds: S1 normal and S2 normal. No murmur. No friction rub. No gallop.   Pulmonary:     Effort: Pulmonary effort is normal. No respiratory distress.     Breath sounds: Normal breath sounds.  Chest:     Chest wall: No tenderness.  Abdominal:     General: Bowel sounds are normal.     Palpations: Abdomen is soft.     Tenderness: There is no abdominal tenderness. There is no guarding or rebound. Negative signs include Murphy's sign and McBurney's sign.     Hernia: No hernia is present.  Musculoskeletal: Normal range of motion.  Skin:    General: Skin is warm and dry.     Findings: No rash.  Neurological:     Mental Status: He is alert and oriented to person, place, and time.     GCS: GCS eye subscore is 4. GCS verbal subscore is 5. GCS motor subscore is 6.     Cranial Nerves: No cranial nerve deficit.     Sensory: No sensory deficit.     Coordination: Coordination normal.     Comments: Extraocular muscle movement: normal No visual field cut Pupils: equal and reactive both direct and consensual response is normal No nystagmus present    Sensory function is intact to light touch, pinprick Proprioception intact  Grip strength 5/5 symmetric in upper extremities No pronator drift Normal finger to nose bilaterally  Lower extremity strength 5/5 against gravity Normal heel to shin bilaterally  Gait: normal   Psychiatric:        Speech: Speech normal.        Behavior: Behavior normal.        Thought Content:  Thought content normal.      ED Treatments / Results  Labs (all labs ordered are listed, but only abnormal results are displayed) Labs Reviewed  BASIC METABOLIC PANEL - Abnormal; Notable for the following components:      Result Value   Potassium 3.4 (*)  Glucose, Bld 142 (*)    All other components within normal limits  URINALYSIS, ROUTINE W REFLEX MICROSCOPIC - Abnormal; Notable for the following components:   Color, Urine COLORLESS (*)    Specific Gravity, Urine 1.001 (*)    All other components within normal limits  CBG MONITORING, ED - Abnormal; Notable for the following components:   Glucose-Capillary 129 (*)    All other components within normal limits  CBC    EKG EKG Interpretation  Date/Time:  Tuesday March 21 2019 21:54:51 EDT Ventricular Rate:  73 PR Interval:    QRS Duration: 97 QT Interval:  371 QTC Calculation: 409 R Axis:   -26 Text Interpretation:  Sinus rhythm Borderline left axis deviation RSR' in V1 or V2, probably normal variant No significant change since last tracing Confirmed by Orpah Greek [phone]) on 03/21/2019 11:01:46 PM   Radiology Ct Cervical Spine Wo Contrast  Result Date: 03/22/2019 CLINICAL DATA:  78 year old male with neck pain. EXAM: CT CERVICAL SPINE WITHOUT CONTRAST TECHNIQUE: Multidetector CT imaging of the cervical spine was performed without intravenous contrast. Multiplanar CT image reconstructions were also generated. COMPARISON:  Cervical spine MRI dated 11/15/1998 FINDINGS: Alignment: No acute subluxation. There is straightening of normal cervical lordosis which may be positional or due to muscle spasm. Skull base and vertebrae: No acute fracture. Soft tissues and spinal canal: No prevertebral fluid or swelling. No visible canal hematoma. Disc levels: Multilevel degenerative changes with endplate irregularity and disc space narrowing most prominent at C5-C6 and C6-C7. Upper chest: Negative. Other: None IMPRESSION: 1. No  acute/traumatic cervical spine pathology. 2. Multilevel degenerative changes. Electronically Signed   By: Anner Crete M.D.   On: 03/22/2019 00:13   Ct Lumbar Spine Wo Contrast  Result Date: 03/22/2019 CLINICAL DATA:  Initial evaluation for back pain, urinary retention. Remote history of surgery. EXAM: CT LUMBAR SPINE WITHOUT CONTRAST TECHNIQUE: Multidetector CT imaging of the lumbar spine was performed without intravenous contrast administration. Multiplanar CT image reconstructions were also generated. COMPARISON:  Prior MRI from 12/02/2008. FINDINGS: Segmentation: Standard. Lowest well-formed disc labeled the L5-S1 level. Alignment: Physiologic with preservation of the normal lumbar lordosis. No listhesis or subluxation. Vertebrae: Vertebral body height maintained without evidence for acute or chronic fracture. Small chronic endplate Schmorl's node present at the superior endplate of L4. Visualized sacrum and pelvis intact. SI joints approximated symmetric. No discrete osseous lesions. Postoperative changes from prior PLIF present at L4-5 and L5-S1. Solid arthrodesis seen at these levels. Hardware appears intact and well-positioned. No periprosthetic lucency to suggest loosening. Paraspinal and other soft tissues: Paraspinous soft tissues demonstrate no acute finding. Mild scattered aorto bi-iliac atherosclerotic disease. Visualized visceral structures otherwise unremarkable. Disc levels: T11-12: Disc desiccation without significant disc bulge. Mild facet hypertrophy. No significant stenosis. T12-L1: Minimal disc bulge. Mild facet hypertrophy. No stenosis. L1-2:  Unremarkable. L2-3: Mild diffuse disc bulge, asymmetric to the right. Superimposed right foraminal/extraforaminal disc protrusion (series 9, image 63). Protruding disc contacts the exiting right L2 nerve root as it courses of the right neural foramen. Mild bilateral facet hypertrophy. No significant spinal stenosis. Moderate right foraminal  narrowing. L3-4: Diffuse disc bulge with mild intervertebral disc space narrowing. Mild reactive endplate changes. Moderate facet and ligament flavum hypertrophy. Resultant moderate canal with bilateral L3 foraminal stenosis. L4-5: Prior posterior and interbody fusion with wide posterior decompression. No residual spinal stenosis. Foramina appear grossly patent. L5-S1: Prior posterior and interbody fusion with wide posterior decompression. No residual spinal stenosis. Foramina appear grossly patent. IMPRESSION:  1. No acute abnormality within the lumbar spine. 2. Postoperative changes from prior PLIF at L4-5 and L5-S1 without residual spinal stenosis. No hardware complication. 3. 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 4. Broad right foraminal/extraforaminal disc protrusion at L2-3, contacting and potentially affecting the right L2 nerve root. Electronically Signed   By: Jeannine Boga M.D.   On: 03/22/2019 01:32    Procedures Procedures (including critical care time)  Medications Ordered in ED Medications  sodium chloride flush (NS) 0.9 % injection 3 mL (3 mLs Intravenous Given 03/21/19 2203)     Initial Impression / Assessment and Plan / ED Course  I have reviewed the triage vital signs and the nursing notes.  Pertinent labs & imaging results that were available during my care of the patient were reviewed by me and considered in my medical decision making (see chart for details).        Patient presents to the emergency department for evaluation of near syncope.  Patient did not actually lose consciousness but felt very disoriented briefly.  Patient complaining of urinary retention.  He has a history of decreased urine stream and needing to go to the bathroom frequently, including getting up multiple times at night, but has not been evaluated by urology.  It sounds as  though patient has been unable to urinate through the course of today.  This sounds like progressive urinary retention, not related to CNS lesion.  He is complaining of neck pain, but the neck pain is bilateral paraspinal and not midline.  He has normal strength, sensation, proprioception and coordination of all 4 extremities.  He has had lumbar surgery but is not experiencing any lumbar pain currently.  CT cervical spine and lumbar spine not worrisome for significant compression of spinal cord or cauda equina syndrome.  This also corroborates exam.  Patient did have greater than 500 mL post void residual.  He was able to urinate here in the ER, just cannot empty the bladder.  A Foley catheter was therefore placed.  Urinalysis does not suggest infection.  No kidney injury on labs.  Patient felt to be safe for discharge with follow-up with urology for removal of Foley catheter.  Final Clinical Impressions(s) / ED Diagnoses   Final diagnoses:  Near syncope  Urinary retention    ED Discharge Orders    None       Lihanna Biever, Gwenyth Allegra, MD 03/22/19 0150    Orpah Greek, MD 03/22/19 [phone]

## 2019-03-21 NOTE — ED Triage Notes (Signed)
Patient arrived by [facility]. Patient complaining of near syncope, neck pain, and back pain. Pain 8/10. Patient is having trouble urinating.

## 2019-03-22 DIAGNOSIS — M542 Cervicalgia: Secondary | ICD-10-CM | POA: Diagnosis not present

## 2019-03-22 DIAGNOSIS — M549 Dorsalgia, unspecified: Secondary | ICD-10-CM | POA: Diagnosis not present

## 2019-03-24 DIAGNOSIS — R338 Other retention of urine: Secondary | ICD-10-CM | POA: Diagnosis not present

## 2019-03-24 DIAGNOSIS — N401 Enlarged prostate with lower urinary tract symptoms: Secondary | ICD-10-CM | POA: Diagnosis not present

## 2019-04-07 DIAGNOSIS — N401 Enlarged prostate with lower urinary tract symptoms: Secondary | ICD-10-CM | POA: Diagnosis not present

## 2019-04-07 DIAGNOSIS — R338 Other retention of urine: Secondary | ICD-10-CM | POA: Diagnosis not present

## 2019-07-21 ENCOUNTER — Ambulatory Visit: Payer: Medicare Other | Attending: Internal Medicine

## 2019-07-21 DIAGNOSIS — Z23 Encounter for immunization: Secondary | ICD-10-CM | POA: Insufficient documentation

## 2019-07-21 NOTE — Progress Notes (Signed)
   Covid-19 Vaccination Clinic  Name:  Arthur Brady    MRN: [id number] DOB: 1940-11-16  07/21/2019  Mr. Arthur Brady was observed post Covid-19 immunization for 30 minutes based on pre-vaccination screening without incidence. He was provided with Vaccine Information Sheet and instruction to access the V-Safe system.   Mr. Arthur Brady was instructed to call 911 with any severe reactions post vaccine: Marland Kitchen Difficulty breathing  . Swelling of your face and throat  . A fast heartbeat  . A bad rash all over your body  . Dizziness and weakness    Immunizations Administered    Name Date Dose VIS Date Route   Pfizer COVID-19 Vaccine 07/21/2019  6:22 PM 0.3 mL 06/09/2019 Intramuscular   Manufacturer: [place]   Lot: [id number]   [place]: [id number]

## 2019-08-11 ENCOUNTER — Ambulatory Visit: Payer: Medicare Other | Attending: Internal Medicine

## 2019-08-11 DIAGNOSIS — Z23 Encounter for immunization: Secondary | ICD-10-CM | POA: Insufficient documentation

## 2019-08-11 NOTE — Progress Notes (Signed)
   Covid-19 Vaccination Clinic  Name:  EMRYK CAULDER    MRN: [id number] DOB: 15-Jun-1941  08/11/2019  Mr. Bingenheimer was observed post Covid-19 immunization for 30 minutes based on pre-vaccination screening without incidence. He was provided with Vaccine Information Sheet and instruction to access the V-Safe system.   Mr. Spofford was instructed to call 911 with any severe reactions post vaccine: Marland Kitchen Difficulty breathing  . Swelling of your face and throat  . A fast heartbeat  . A bad rash all over your body  . Dizziness and weakness    Immunizations Administered    Name Date Dose VIS Date Route   Pfizer COVID-19 Vaccine 08/11/2019  5:25 PM 0.3 mL 06/09/2019 Intramuscular   Manufacturer: [place]   Lot: [id number]   [place]: [id number]

## 2019-08-15 DIAGNOSIS — M179 Osteoarthritis of knee, unspecified: Secondary | ICD-10-CM | POA: Diagnosis not present

## 2019-08-15 DIAGNOSIS — K219 Gastro-esophageal reflux disease without esophagitis: Secondary | ICD-10-CM | POA: Diagnosis not present

## 2019-08-15 DIAGNOSIS — Z0001 Encounter for general adult medical examination with abnormal findings: Secondary | ICD-10-CM | POA: Diagnosis not present

## 2019-08-15 DIAGNOSIS — Z1389 Encounter for screening for other disorder: Secondary | ICD-10-CM | POA: Diagnosis not present

## 2019-09-19 DIAGNOSIS — R030 Elevated blood-pressure reading, without diagnosis of hypertension: Secondary | ICD-10-CM | POA: Diagnosis not present

## 2019-09-19 DIAGNOSIS — N401 Enlarged prostate with lower urinary tract symptoms: Secondary | ICD-10-CM | POA: Diagnosis not present

## 2019-10-13 DIAGNOSIS — R351 Nocturia: Secondary | ICD-10-CM | POA: Diagnosis not present

## 2019-10-13 DIAGNOSIS — N401 Enlarged prostate with lower urinary tract symptoms: Secondary | ICD-10-CM | POA: Diagnosis not present

## 2019-11-14 DIAGNOSIS — R030 Elevated blood-pressure reading, without diagnosis of hypertension: Secondary | ICD-10-CM | POA: Diagnosis not present

## 2020-06-16 ENCOUNTER — Other Ambulatory Visit: Payer: Self-pay

## 2020-06-16 ENCOUNTER — Emergency Department (HOSPITAL_COMMUNITY): Payer: Medicare Other

## 2020-06-16 ENCOUNTER — Encounter (HOSPITAL_COMMUNITY): Payer: Self-pay

## 2020-06-16 ENCOUNTER — Emergency Department (HOSPITAL_COMMUNITY)
Admission: EM | Admit: 2020-06-16 | Discharge: 2020-06-16 | Disposition: A | Discharge status: Home / Self Care | Payer: Medicare Other | Attending: Emergency Medicine | Admitting: Emergency Medicine

## 2020-06-16 DIAGNOSIS — Z87891 Personal history of nicotine dependence: Secondary | ICD-10-CM | POA: Diagnosis not present

## 2020-06-16 DIAGNOSIS — R339 Retention of urine, unspecified: Secondary | ICD-10-CM | POA: Insufficient documentation

## 2020-06-16 DIAGNOSIS — F10129 Alcohol abuse with intoxication, unspecified: Secondary | ICD-10-CM | POA: Diagnosis not present

## 2020-06-16 DIAGNOSIS — R109 Unspecified abdominal pain: Secondary | ICD-10-CM | POA: Diagnosis not present

## 2020-06-16 DIAGNOSIS — T50904A Poisoning by unspecified drugs, medicaments and biological substances, undetermined, initial encounter: Secondary | ICD-10-CM | POA: Diagnosis not present

## 2020-06-16 DIAGNOSIS — Y906 Blood alcohol level of 120-199 mg/100 ml: Secondary | ICD-10-CM | POA: Diagnosis not present

## 2020-06-16 DIAGNOSIS — R1084 Generalized abdominal pain: Secondary | ICD-10-CM | POA: Diagnosis not present

## 2020-06-16 DIAGNOSIS — F10220 Alcohol dependence with intoxication, uncomplicated: Secondary | ICD-10-CM | POA: Diagnosis not present

## 2020-06-16 DIAGNOSIS — R1033 Periumbilical pain: Secondary | ICD-10-CM | POA: Diagnosis not present

## 2020-06-16 DIAGNOSIS — R112 Nausea with vomiting, unspecified: Secondary | ICD-10-CM | POA: Insufficient documentation

## 2020-06-16 DIAGNOSIS — F10920 Alcohol use, unspecified with intoxication, uncomplicated: Secondary | ICD-10-CM

## 2020-06-16 DIAGNOSIS — R42 Dizziness and giddiness: Secondary | ICD-10-CM | POA: Diagnosis not present

## 2020-06-16 DIAGNOSIS — R52 Pain, unspecified: Secondary | ICD-10-CM | POA: Diagnosis not present

## 2020-06-16 DIAGNOSIS — R404 Transient alteration of awareness: Secondary | ICD-10-CM | POA: Diagnosis not present

## 2020-06-16 LAB — RAPID URINE DRUG SCREEN, HOSP PERFORMED
Amphetamines: NOT DETECTED
Barbiturates: NOT DETECTED
Benzodiazepines: NOT DETECTED
Cocaine: NOT DETECTED
Opiates: NOT DETECTED
Tetrahydrocannabinol: NOT DETECTED

## 2020-06-16 LAB — URINALYSIS, ROUTINE W REFLEX MICROSCOPIC
Bilirubin Urine: NEGATIVE
Glucose, UA: NEGATIVE mg/dL
Ketones, ur: NEGATIVE mg/dL
Leukocytes,Ua: NEGATIVE
Nitrite: NEGATIVE
Protein, ur: NEGATIVE mg/dL
Specific Gravity, Urine: 1.02 (ref 1.005–1.030)
pH: 7.5 (ref 5.0–8.0)

## 2020-06-16 LAB — URINALYSIS, MICROSCOPIC (REFLEX)
Bacteria, UA: NONE SEEN
WBC, UA: NONE SEEN WBC/hpf (ref 0–5)

## 2020-06-16 LAB — CBC WITH DIFFERENTIAL/PLATELET
Abs Immature Granulocytes: 0.03 10*3/uL (ref 0.00–0.07)
Basophils Absolute: 0 10*3/uL (ref 0.0–0.1)
Basophils Relative: 0 %
Eosinophils Absolute: 0 10*3/uL (ref 0.0–0.5)
Eosinophils Relative: 0 %
HCT: 44.8 % (ref 39.0–52.0)
Hemoglobin: 15 g/dL (ref 13.0–17.0)
Immature Granulocytes: 0 %
Lymphocytes Relative: 18 %
Lymphs Abs: 1.5 10*3/uL (ref 0.7–4.0)
MCH: 30.4 pg (ref 26.0–34.0)
MCHC: 33.5 g/dL (ref 30.0–36.0)
MCV: 90.7 fL (ref 80.0–100.0)
Monocytes Absolute: 0.4 10*3/uL (ref 0.1–1.0)
Monocytes Relative: 5 %
Neutro Abs: 6.2 10*3/uL (ref 1.7–7.7)
Neutrophils Relative %: 77 %
Platelets: 164 10*3/uL (ref 150–400)
RBC: 4.94 MIL/uL (ref 4.22–5.81)
RDW: 13 % (ref 11.5–15.5)
WBC: 8.1 10*3/uL (ref 4.0–10.5)
nRBC: 0 % (ref 0.0–0.2)

## 2020-06-16 LAB — COMPREHENSIVE METABOLIC PANEL
ALT: 16 U/L (ref 0–44)
AST: 18 U/L (ref 15–41)
Albumin: 3.9 g/dL (ref 3.5–5.0)
Alkaline Phosphatase: 41 U/L (ref 38–126)
Anion gap: 5 (ref 5–15)
BUN: 16 mg/dL (ref 8–23)
CO2: 30 mmol/L (ref 22–32)
Calcium: 9.2 mg/dL (ref 8.9–10.3)
Chloride: 105 mmol/L (ref 98–111)
Creatinine, Ser: 1.03 mg/dL (ref 0.61–1.24)
GFR, Estimated: >60 mL/min (ref >60)
Glucose, Bld: 136 mg/dL — ABNORMAL HIGH (ref 70–99)
Potassium: 3.7 mmol/L (ref 3.5–5.1)
Sodium: 140 mmol/L (ref 135–145)
Total Bilirubin: 0.6 mg/dL (ref 0.3–1.2)
Total Protein: 6.4 g/dL — ABNORMAL LOW (ref 6.5–8.1)

## 2020-06-16 LAB — ETHANOL: Alcohol, Ethyl (B): 168 mg/dL — ABNORMAL HIGH (ref <10)

## 2020-06-16 MED ORDER — SODIUM CHLORIDE 0.9 % IV BOLUS
1000.0000 mL | Freq: Once | INTRAVENOUS | Status: AC
  Administered 2020-06-16: 1000 mL via INTRAVENOUS

## 2020-06-16 MED ORDER — ONDANSETRON HCL 4 MG PO TABS
4.0000 mg | ORAL_TABLET | Freq: Three times a day (TID) | ORAL | 0 refills | Status: AC | PRN
Start: 2020-06-16 — End: ?

## 2020-06-16 MED ORDER — IOHEXOL 300 MG/ML  SOLN
100.0000 mL | Freq: Once | INTRAMUSCULAR | Status: AC | PRN
  Administered 2020-06-16: 05:00:00 100 mL via INTRAVENOUS

## 2020-06-16 MED ORDER — PANTOPRAZOLE SODIUM 20 MG PO TBEC: 20.0000 mg | DELAYED_RELEASE_TABLET | Freq: Every day | ORAL | 0 refills | Status: AC

## 2020-06-16 MED ORDER — PANTOPRAZOLE SODIUM 40 MG IV SOLR
40.0000 mg | Freq: Once | INTRAVENOUS | Status: AC
Start: 2020-06-16 — End: 2020-06-16
  Administered 2020-06-16: 40 mg via INTRAVENOUS
  Filled 2020-06-16: qty 40

## 2020-06-16 NOTE — Discharge Instructions (Addendum)
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   Follow up with Dr. Alinda Money for re-evaluation of recurrent urinary retention.   Return to the emergency department with any new or concerning symptoms.

## 2020-06-16 NOTE — ED Notes (Signed)
Patient transported to CT 

## 2020-06-16 NOTE — ED Notes (Signed)
Pt cleaned up from an episode of vomiting and placed in a fresh gown.

## 2020-06-16 NOTE — ED Provider Notes (Signed)
[place] [facility] DEPT Provider Note   CSN: [id number] Arrival date & time: 06/16/20  0038     History Chief Complaint  Patient presents with  . Alcohol Intoxication    Arthur Brady is a 79 y.o. male.  EMS called to patient's home for possible alcohol intoxication with vomiting. The patient reportedly ran outside the house during a party at his home stating he was hot, then collapsed without syncope when they went back inside. He has been vomiting. No reported head injury. He received 4 mg Zofran in route but had one additional episode of nonbloody vomiting on arrival. The patient reports epigastric pain and dizziness, denies headache, chest pain or shortness of breath. He reports drinking alcohol tonight but denies other substance use.   The history is provided by the patient, the spouse and the EMS personnel. No language interpreter was used.  Alcohol Intoxication Associated symptoms include abdominal pain. Pertinent negatives include no chest pain, no headaches and no shortness of breath.       Past Medical History:  Diagnosis Date  . Abdominal pain   . Chronic kidney disease    has had kidney stones  . DJD (degenerative joint disease) of knee   . GERD (gastroesophageal reflux disease)   . Lumbar degenerative disc disease   . Malignant hyperthermia   . NSAID long-term use   . Pulmonary embolism, iatrogenic ([facility])   . Shortness of breath     Patient Active Problem List   Diagnosis Date Noted  . Hemiplegia, unspecified, affecting dominant side 04/11/2012  . Eye pain 04/11/2012  . Rash 04/11/2012  . Abdominal pain 05/08/2011  . Hydronephrosis, left 05/08/2011  . NSAID long-term use     Past Surgical History:  Procedure Laterality Date  . arthroscopy of knee left    . BACK SURGERY         History reviewed. No pertinent family history.  Social History   Tobacco Use  . Smoking status: Former Smoker    Packs/day: 3.00    Years:  8.00    Pack years: 24.00    Types: Cigarettes    Quit date: 05/07/1965    Years since quitting: 55.1  . Smokeless tobacco: Never Used  Vaping Use  . Vaping Use: Never used  Substance Use Topics  . Alcohol use: Yes    Alcohol/week: 1.0 standard drink    Types: 1 Glasses of wine per week  . Drug use: No    Home Medications Prior to Admission medications   Medication Sig Start Date End Date Taking? Authorizing Provider  meclizine (ANTIVERT) 25 MG tablet Take 1 tablet (25 mg total) by mouth 3 (three) times daily as needed for dizziness. Patient not taking: Reported on 03/21/2019 01/10/15   Waynetta Pean, PA-C  ondansetron (ZOFRAN ODT) 4 MG disintegrating tablet Take 1 tablet (4 mg total) by mouth every 8 (eight) hours as needed for nausea or vomiting. Patient not taking: Reported on 03/21/2019 01/10/15   Waynetta Pean, PA-C    Allergies    Codeine phosphate, Morphine and related, Penicillins, Tetanus toxoids, and Voltaren [diclofenac sodium]  Review of Systems   Review of Systems  Constitutional: Negative for chills and fever.  HENT: Negative.   Eyes: Negative for visual disturbance.  Respiratory: Negative.  Negative for shortness of breath.   Cardiovascular: Negative.  Negative for chest pain.  Gastrointestinal: Positive for abdominal pain, nausea and vomiting.  Genitourinary: Negative.  Negative for enuresis.  Musculoskeletal: Negative.  Skin: Negative.   Neurological: Positive for dizziness. Negative for syncope, weakness and headaches.    Physical Exam Updated Vital Signs BP 118/72 (BP Location: Right Arm)   Pulse 62   Temp (!) 97.4 F (36.3 C) (Oral)   Resp 15   Ht 5\' 5"  (1.651 m)   Wt 62.1 kg   SpO2 95%   BMI 22.80 kg/m   Physical Exam Vitals and nursing note reviewed.  Constitutional:      General: He is not in acute distress. Eyes:     Conjunctiva/sclera: Conjunctivae normal.     Pupils: Pupils are equal, round, and reactive to light.  Cardiovascular:      Rate and Rhythm: Normal rate and regular rhythm.     Heart sounds: No murmur heard.   Pulmonary:     Effort: Pulmonary effort is normal.     Breath sounds: No wheezing, rhonchi or rales.  Chest:     Chest wall: No tenderness.  Abdominal:     General: There is no distension.     Palpations: Abdomen is soft.     Tenderness: There is abdominal tenderness (periumbilical). There is no guarding.  Musculoskeletal:        General: Normal range of motion.     Cervical back: Normal range of motion and neck supple.  Skin:    General: Skin is warm and dry.  Neurological:     General: No focal deficit present.     ED Results / Procedures / Treatments   Labs (all labs ordered are listed, but only abnormal results are displayed) Labs Reviewed  CBC WITH DIFFERENTIAL/PLATELET  ETHANOL  COMPREHENSIVE METABOLIC PANEL  RAPID URINE DRUG SCREEN, HOSP PERFORMED   Results for orders placed or performed during the hospital encounter of 06/16/20  CBC with Differential  Result Value Ref Range   WBC 8.1 4.0 - 10.5 K/uL   RBC 4.94 4.22 - 5.81 MIL/uL   Hemoglobin 15.0 13.0 - 17.0 g/dL   HCT 44.8 39.0 - 52.0 %   MCV 90.7 80.0 - 100.0 fL   MCH 30.4 26.0 - 34.0 pg   MCHC 33.5 30.0 - 36.0 g/dL   RDW 13.0 11.5 - 15.5 %   Platelets 164 150 - 400 K/uL   nRBC 0.0 0.0 - 0.2 %   Neutrophils Relative % 77 %   Neutro Abs 6.2 1.7 - 7.7 K/uL   Lymphocytes Relative 18 %   Lymphs Abs 1.5 0.7 - 4.0 K/uL   Monocytes Relative 5 %   Monocytes Absolute 0.4 0.1 - 1.0 K/uL   Eosinophils Relative 0 %   Eosinophils Absolute 0.0 0.0 - 0.5 K/uL   Basophils Relative 0 %   Basophils Absolute 0.0 0.0 - 0.1 K/uL   Immature Granulocytes 0 %   Abs Immature Granulocytes 0.03 0.00 - 0.07 K/uL  Ethanol  Result Value Ref Range   Alcohol, Ethyl (B) 168 (H) <10 mg/dL  Comprehensive metabolic panel  Result Value Ref Range   Sodium 140 135 - 145 mmol/L   Potassium 3.7 3.5 - 5.1 mmol/L   Chloride 105 98 - 111 mmol/L    CO2 30 22 - 32 mmol/L   Glucose, Bld 136 (H) 70 - 99 mg/dL   BUN 16 8 - 23 mg/dL   Creatinine, Ser 1.03 0.61 - 1.24 mg/dL   Calcium 9.2 8.9 - 10.3 mg/dL   Total Protein 6.4 (L) 6.5 - 8.1 g/dL   Albumin 3.9 3.5 - 5.0 g/dL  AST 18 15 - 41 U/L   ALT 16 0 - 44 U/L   Alkaline Phosphatase 41 38 - 126 U/L   Total Bilirubin 0.6 0.3 - 1.2 mg/dL   GFR, Estimated >60 >60 mL/min   Anion gap 5 5 - 15  Urine rapid drug screen (hosp performed)  Result Value Ref Range   Opiates NONE DETECTED NONE DETECTED   Cocaine NONE DETECTED NONE DETECTED   Benzodiazepines NONE DETECTED NONE DETECTED   Amphetamines NONE DETECTED NONE DETECTED   Tetrahydrocannabinol NONE DETECTED NONE DETECTED   Barbiturates NONE DETECTED NONE DETECTED    EKG None  Radiology No results found.  Procedures Procedures (including critical care time)  Medications Ordered in ED Medications  sodium chloride 0.9 % bolus 1,000 mL (has no administration in time range)    ED Course  I have reviewed the triage vital signs and the nursing notes.  Pertinent labs & imaging results that were available during my care of the patient were reviewed by me and considered in my medical decision making (see chart for details).    MDM Rules/Calculators/A&P                          Patient from home with dizziness and vomiting. Per EMS report, and later by wife at the bedside, there was a party at their house tonight and he had several alcoholic drinks. No syncope, chest pain. He c/o epigastric pain.  Vomiting controlled in the ED. VSS. IVF's provided. Labs pending. Do not suspect head injury.   IV protonix provided for epigastric pain. Per wife, the patient c/o this pain on a daily basis. He also drinks alcohol regularly. He has not had GI or PCP follow up for pain as "he refuses to go to the doctor." CT scan ordered for full evaluation given age, alcohol use, tenderness, reliably poor follow up.   The patient has not been able to  urinate while in ED. He states he feels he has to urinate but cannot start a stream. Reports history of same requiring Foley placement and urology follow up Alinda Money). Foley placed that drained a total 600 cc urine. Will leave foley in place and encourage urology follow up this week.   Alcohol elevated at 168. UDS negative. CT scan negative. Symptoms felt likely secondary to alcohol over-use. On re-evaluation the patient has had no further vomiting. He is awake, increasingly alert, no dizziness. He has been seen by Dr. Dina Rich and is felt appropriate for discharge home. Return precautions discussed. Will keep on PPI.  Final Clinical Impression(s) / ED Diagnoses Final diagnoses:  None   1. Alcohol intoxication 2. Nausea and vomiting 3. Urinary retention   Rx / DC Orders ED Discharge Orders    None       Charlann Lange, PA-C 06/16/20 [id number]    Merryl Hacker, MD 06/18/20 [phone]

## 2020-06-16 NOTE — ED Triage Notes (Signed)
Patient BIB [facility] from home. Patients wife said they had a party at their house tonight and the patient had several alcoholic beverages. Patient normally drinks vodka and falls asleep in front of the TV nightly. He ran outside at the party saying he was hot. Wife got him back inside and he collapsed to the ground. Patient complains of stomach pain.

## 2020-06-19 ENCOUNTER — Other Ambulatory Visit: Payer: Self-pay | Admitting: Internal Medicine

## 2020-06-19 DIAGNOSIS — R1013 Epigastric pain: Secondary | ICD-10-CM | POA: Diagnosis not present

## 2020-06-19 DIAGNOSIS — R112 Nausea with vomiting, unspecified: Secondary | ICD-10-CM | POA: Diagnosis not present

## 2020-06-19 DIAGNOSIS — I10 Essential (primary) hypertension: Secondary | ICD-10-CM | POA: Diagnosis not present

## 2020-06-20 DIAGNOSIS — R350 Frequency of micturition: Secondary | ICD-10-CM | POA: Diagnosis not present

## 2020-06-20 DIAGNOSIS — N401 Enlarged prostate with lower urinary tract symptoms: Secondary | ICD-10-CM | POA: Diagnosis not present

## 2020-06-20 DIAGNOSIS — R338 Other retention of urine: Secondary | ICD-10-CM | POA: Diagnosis not present

## 2020-06-20 DIAGNOSIS — N3943 Post-void dribbling: Secondary | ICD-10-CM | POA: Diagnosis not present

## 2020-06-25 DIAGNOSIS — R351 Nocturia: Secondary | ICD-10-CM | POA: Diagnosis not present

## 2020-06-25 DIAGNOSIS — N401 Enlarged prostate with lower urinary tract symptoms: Secondary | ICD-10-CM | POA: Diagnosis not present

## 2020-07-05 ENCOUNTER — Ambulatory Visit
Admission: RE | Admit: 2020-07-05 | Discharge: 2020-07-05 | Disposition: A | Discharge status: Home / Self Care | Payer: Medicare Other | Source: Ambulatory Visit | Attending: Internal Medicine | Admitting: Internal Medicine

## 2020-07-05 DIAGNOSIS — R1013 Epigastric pain: Secondary | ICD-10-CM

## 2020-07-05 DIAGNOSIS — K209 Esophagitis, unspecified without bleeding: Secondary | ICD-10-CM | POA: Diagnosis not present

## 2020-07-19 DIAGNOSIS — R1013 Epigastric pain: Secondary | ICD-10-CM | POA: Diagnosis not present

## 2020-07-19 DIAGNOSIS — I10 Essential (primary) hypertension: Secondary | ICD-10-CM | POA: Diagnosis not present

## 2020-07-19 DIAGNOSIS — R112 Nausea with vomiting, unspecified: Secondary | ICD-10-CM | POA: Diagnosis not present

## 2020-07-19 DIAGNOSIS — H8110 Benign paroxysmal vertigo, unspecified ear: Secondary | ICD-10-CM | POA: Diagnosis not present

## 2020-08-26 DIAGNOSIS — Z0001 Encounter for general adult medical examination with abnormal findings: Secondary | ICD-10-CM | POA: Diagnosis not present

## 2020-08-26 DIAGNOSIS — N401 Enlarged prostate with lower urinary tract symptoms: Secondary | ICD-10-CM | POA: Diagnosis not present

## 2020-08-26 DIAGNOSIS — H8110 Benign paroxysmal vertigo, unspecified ear: Secondary | ICD-10-CM | POA: Diagnosis not present

## 2020-08-26 DIAGNOSIS — I10 Essential (primary) hypertension: Secondary | ICD-10-CM | POA: Diagnosis not present

## 2020-09-27 DIAGNOSIS — N401 Enlarged prostate with lower urinary tract symptoms: Secondary | ICD-10-CM | POA: Diagnosis not present

## 2020-09-27 DIAGNOSIS — R351 Nocturia: Secondary | ICD-10-CM | POA: Diagnosis not present

## 2020-11-20 DIAGNOSIS — I10 Essential (primary) hypertension: Secondary | ICD-10-CM | POA: Diagnosis not present

## 2020-11-20 DIAGNOSIS — K219 Gastro-esophageal reflux disease without esophagitis: Secondary | ICD-10-CM | POA: Diagnosis not present

## 2020-11-20 DIAGNOSIS — N401 Enlarged prostate with lower urinary tract symptoms: Secondary | ICD-10-CM | POA: Diagnosis not present

## 2020-11-20 DIAGNOSIS — H8110 Benign paroxysmal vertigo, unspecified ear: Secondary | ICD-10-CM | POA: Diagnosis not present

## 2021-03-19 ENCOUNTER — Other Ambulatory Visit: Payer: Self-pay

## 2021-03-19 ENCOUNTER — Other Ambulatory Visit: Payer: Self-pay | Admitting: Physician Assistant

## 2021-03-19 ENCOUNTER — Ambulatory Visit: Payer: Medicare Other | Admitting: Physician Assistant

## 2021-03-19 ENCOUNTER — Encounter: Payer: Self-pay | Admitting: Physician Assistant

## 2021-03-19 DIAGNOSIS — Z1283 Encounter for screening for malignant neoplasm of skin: Secondary | ICD-10-CM

## 2021-03-19 DIAGNOSIS — L821 Other seborrheic keratosis: Secondary | ICD-10-CM | POA: Diagnosis not present

## 2021-03-19 DIAGNOSIS — L570 Actinic keratosis: Secondary | ICD-10-CM

## 2021-03-19 DIAGNOSIS — D485 Neoplasm of uncertain behavior of skin: Secondary | ICD-10-CM | POA: Diagnosis not present

## 2021-03-19 NOTE — Patient Instructions (Signed)

## 2021-03-19 NOTE — Progress Notes (Signed)
   New Patient   Subjective  Arthur Brady is a 80 y.o. male who presents for the following: Annual Exam (Last ov 2018 no history of skin right chest new x 6-8 months and itches, right abdomen color change and itch).   The following portions of the chart were reviewed this encounter and updated as appropriate:  Tobacco  Allergies  Meds  Problems  Med Hx  Surg Hx  Fam Hx      Objective  Well appearing patient in no apparent distress; mood and affect are within normal limits.  All skin waist up examined.  Neck - Anterior Bichromic dark nested macule.      Dorsum of Nose Erythematous patches with gritty scale.      Assessment & Plan  Neoplasm of uncertain behavior of skin Neck - Anterior  Skin / nail biopsy Type of biopsy: tangential   Informed consent: discussed and consent obtained   Timeout: patient name, date of birth, surgical site, and procedure verified   Anesthesia: the lesion was anesthetized in a standard fashion   Anesthetic:  1% lidocaine w/ epinephrine 1-100,000 local infiltration Instrument used: flexible razor blade   Hemostasis achieved with: aluminum chloride and electrodesiccation   Outcome: patient tolerated procedure well   Post-procedure details: wound care instructions given    Specimen 1 - Surgical pathology Differential Diagnosis: atypia  Check Margins: No  AK (actinic keratosis) Dorsum of Nose  Destruction of lesion - Dorsum of Nose Complexity: simple   Destruction method: cryotherapy   Informed consent: discussed and consent obtained   Timeout:  patient name, date of birth, surgical site, and procedure verified Lesion destroyed using liquid nitrogen: Yes   Cryotherapy cycles:  3 Outcome: patient tolerated procedure well with no complications   Post-procedure details: wound care instructions given       I, Chloie Loney, PA-C, have reviewed all documentation's for this visit.  The documentation on 03/19/21 for the exam,  diagnosis, procedures and orders are all accurate and complete.

## 2021-03-27 DIAGNOSIS — I10 Essential (primary) hypertension: Secondary | ICD-10-CM | POA: Diagnosis not present

## 2021-03-27 DIAGNOSIS — K219 Gastro-esophageal reflux disease without esophagitis: Secondary | ICD-10-CM | POA: Diagnosis not present

## 2021-07-07 DIAGNOSIS — M6530 Trigger finger, unspecified finger: Secondary | ICD-10-CM | POA: Diagnosis not present

## 2021-07-18 DIAGNOSIS — M65342 Trigger finger, left ring finger: Secondary | ICD-10-CM | POA: Diagnosis not present

## 2021-09-05 DIAGNOSIS — H8110 Benign paroxysmal vertigo, unspecified ear: Secondary | ICD-10-CM | POA: Diagnosis not present

## 2021-09-05 DIAGNOSIS — E559 Vitamin D deficiency, unspecified: Secondary | ICD-10-CM | POA: Diagnosis not present

## 2021-09-05 DIAGNOSIS — N401 Enlarged prostate with lower urinary tract symptoms: Secondary | ICD-10-CM | POA: Diagnosis not present

## 2021-09-05 DIAGNOSIS — I10 Essential (primary) hypertension: Secondary | ICD-10-CM | POA: Diagnosis not present

## 2021-09-05 DIAGNOSIS — N529 Male erectile dysfunction, unspecified: Secondary | ICD-10-CM | POA: Diagnosis not present

## 2021-09-05 DIAGNOSIS — Z0001 Encounter for general adult medical examination with abnormal findings: Secondary | ICD-10-CM | POA: Diagnosis not present

## 2021-09-15 DIAGNOSIS — Z1212 Encounter for screening for malignant neoplasm of rectum: Secondary | ICD-10-CM | POA: Diagnosis not present

## 2021-09-15 DIAGNOSIS — Z1211 Encounter for screening for malignant neoplasm of colon: Secondary | ICD-10-CM | POA: Diagnosis not present

## 2021-09-30 DIAGNOSIS — R351 Nocturia: Secondary | ICD-10-CM | POA: Diagnosis not present

## 2021-09-30 DIAGNOSIS — N3943 Post-void dribbling: Secondary | ICD-10-CM | POA: Diagnosis not present

## 2021-09-30 DIAGNOSIS — N401 Enlarged prostate with lower urinary tract symptoms: Secondary | ICD-10-CM | POA: Diagnosis not present

## 2021-10-08 DIAGNOSIS — R1032 Left lower quadrant pain: Secondary | ICD-10-CM | POA: Diagnosis not present

## 2021-10-09 ENCOUNTER — Ambulatory Visit
Admission: RE | Admit: 2021-10-09 | Discharge: 2021-10-09 | Disposition: A | Discharge status: Home / Self Care | Payer: Medicare Other | Source: Ambulatory Visit | Attending: Internal Medicine | Admitting: Internal Medicine

## 2021-10-09 ENCOUNTER — Other Ambulatory Visit: Payer: Self-pay | Admitting: Internal Medicine

## 2021-10-09 DIAGNOSIS — R1032 Left lower quadrant pain: Secondary | ICD-10-CM

## 2021-10-09 DIAGNOSIS — K5730 Diverticulosis of large intestine without perforation or abscess without bleeding: Secondary | ICD-10-CM | POA: Diagnosis not present

## 2021-10-09 MED ORDER — IOPAMIDOL (ISOVUE-300) INJECTION 61%
100.0000 mL | Freq: Once | INTRAVENOUS | Status: AC | PRN
  Administered 2021-10-09: 100 mL via INTRAVENOUS

## 2021-10-10 DIAGNOSIS — K5792 Diverticulitis of intestine, part unspecified, without perforation or abscess without bleeding: Secondary | ICD-10-CM | POA: Diagnosis not present

## 2021-12-14 IMAGING — CT CT ABD-PELV W/ CM
2 of 5 series · 16 of 46 positions shown, 18 images · IV contrast (omnipaque)
Comparison: 05/08/2011

CLINICAL DATA: Epigastric abdominal pain.

EXAM:
CT ABDOMEN AND PELVIS WITH CONTRAST
TECHNIQUE: Multidetector CT imaging of the abdomen and pelvis was performed
using the standard protocol following bolus administration of
intravenous contrast.
CONTRAST:  100mL OMNIPAQUE IOHEXOL 300 MG/ML  SOLN

[Series 2: axial st · axial · 0.73mm/px · z∈[-494,-104]mm · 13 of 92 slices shown, 15 images]
[im 7/92  soft-tissue]
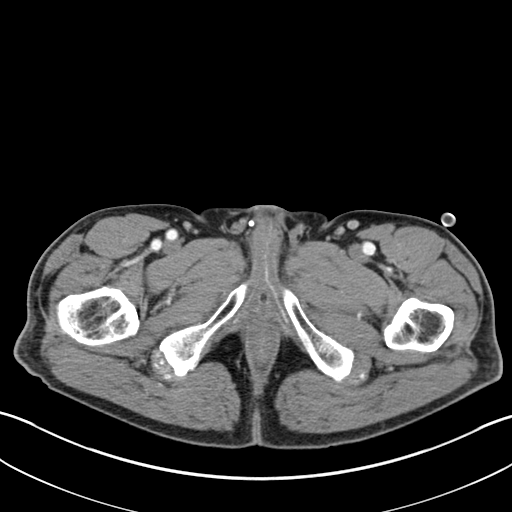
[im 7/92  bone]
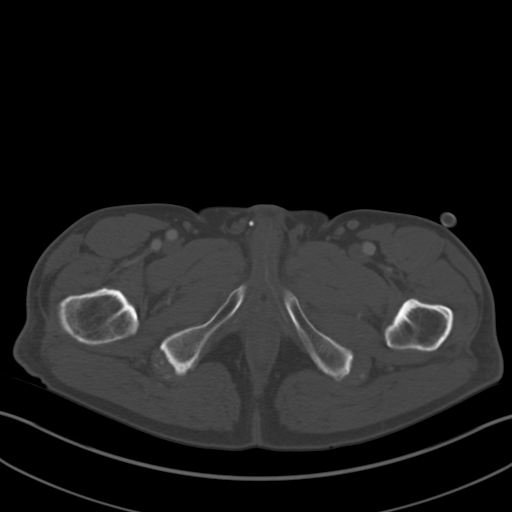
[im 14/92  soft-tissue]
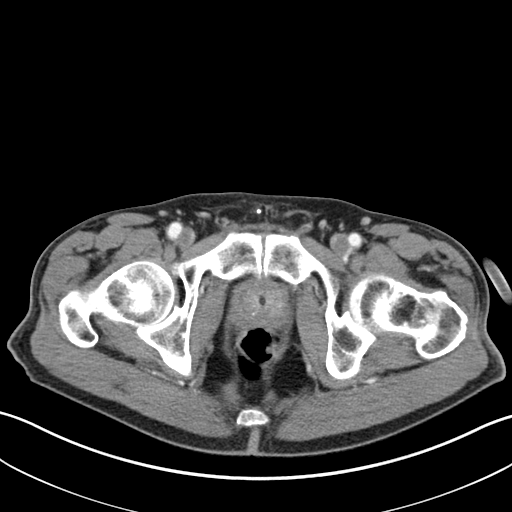
[im 20/92  soft-tissue]
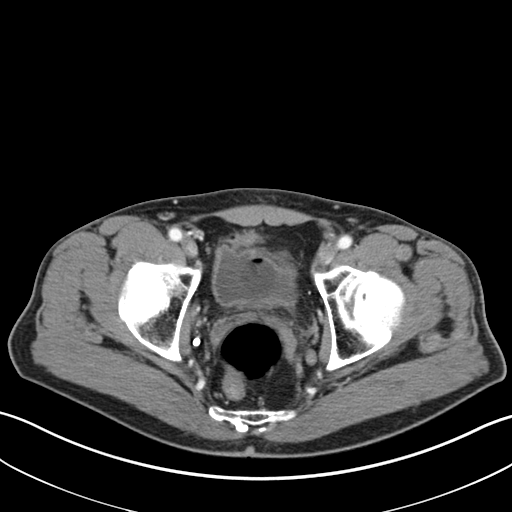
[im 27/92  soft-tissue]
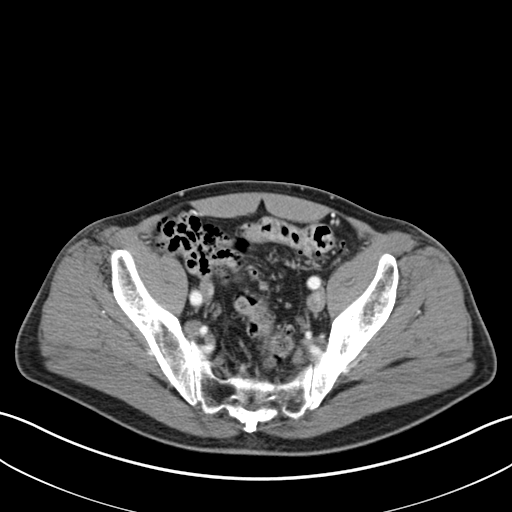
[im 33/92  soft-tissue]
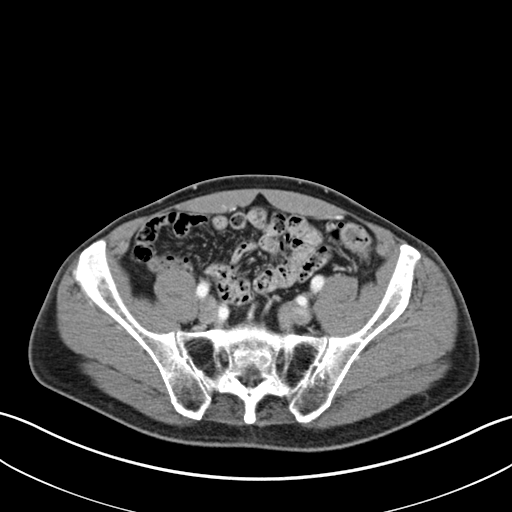
[im 40/92  soft-tissue]
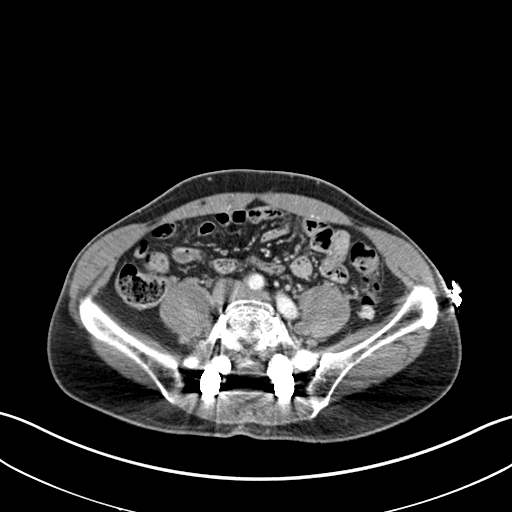
[im 46/92  soft-tissue]
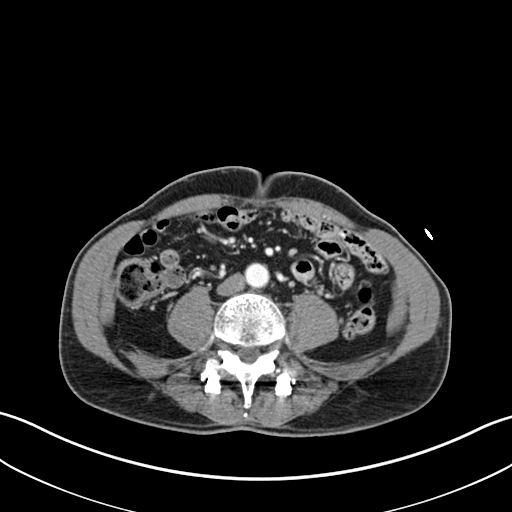
[im 53/92  soft-tissue]
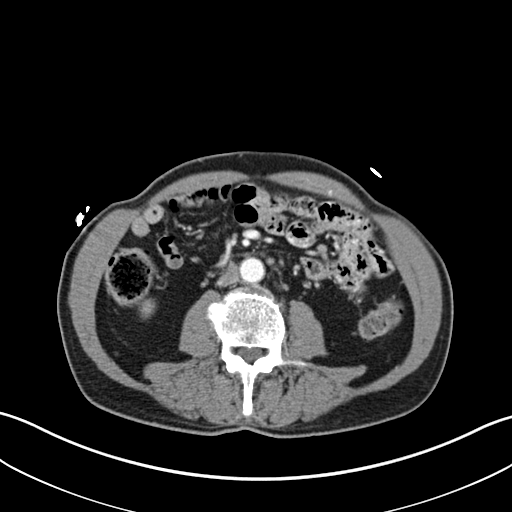
[im 59/92  soft-tissue]
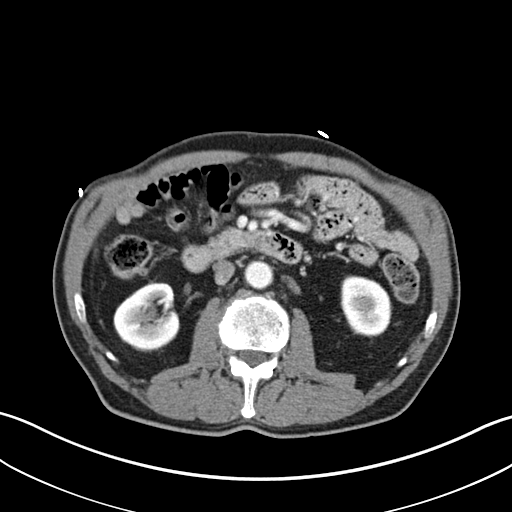
[im 59/92  bone]
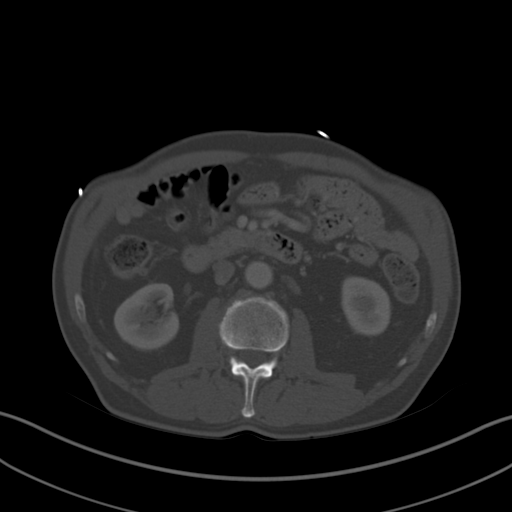
[im 66/92  soft-tissue]
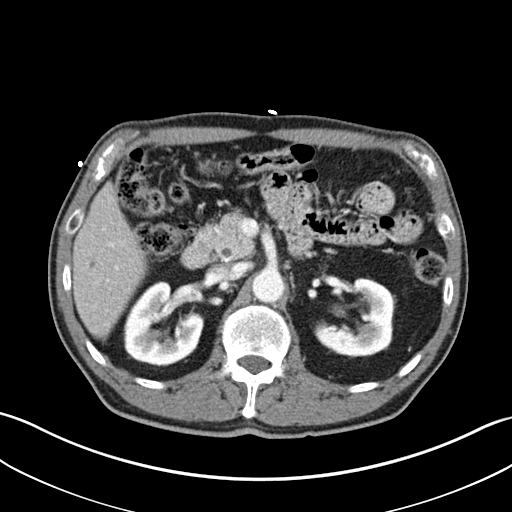
[im 72/92  soft-tissue]
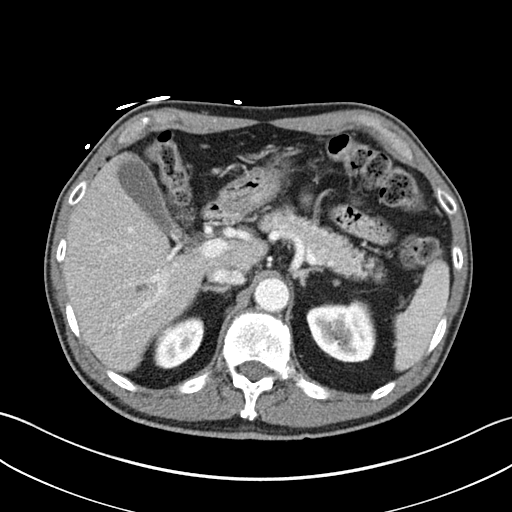
[im 79/92  soft-tissue]
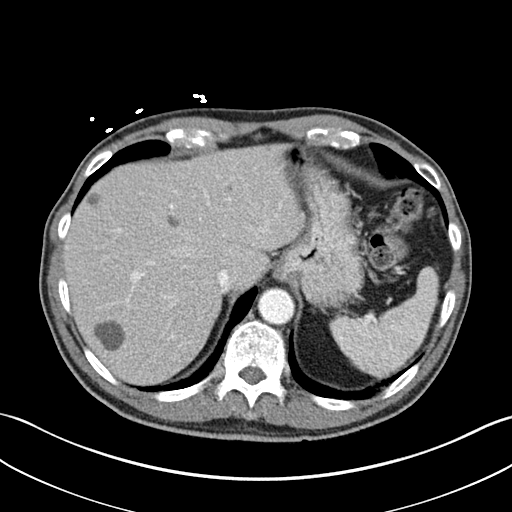
[im 85/92  soft-tissue]
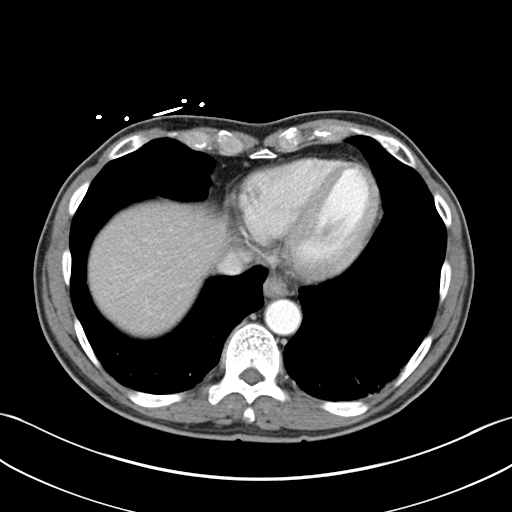

[Series 5: coronal st · coronal · 0.71mm/px · 3 of 137 slices shown]
[im 46/137  soft-tissue]
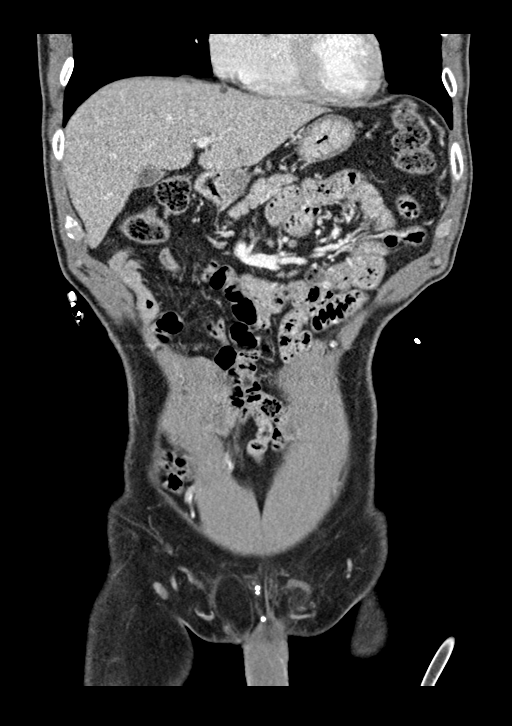
[im 61/137  soft-tissue]
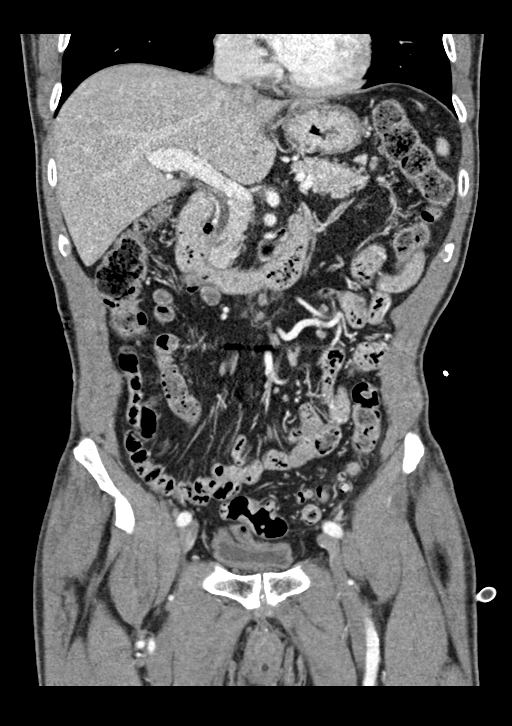
[im 76/137  soft-tissue]
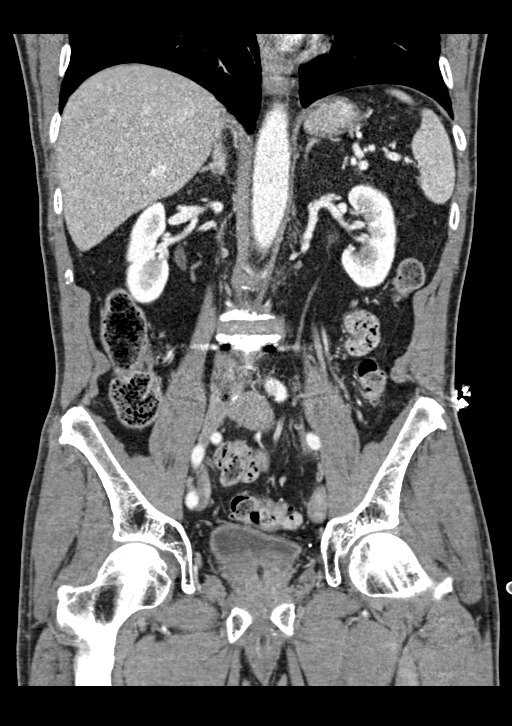

[16 of 46 positions shown; findings below may reference images not displayed]

FINDINGS: Lower Chest: No acute findings.

Hepatobiliary: Multiple small hepatic cysts are again seen. No
hepatic masses identified. Gallbladder is unremarkable. No evidence
of biliary ductal dilatation.

Pancreas:  No mass or inflammatory changes.

Spleen: Within normal limits in size and appearance.

Adrenals/Urinary Tract: No masses identified. No evidence of
ureteral calculi or hydronephrosis. Foley catheter is seen within
the urinary bladder.

Stomach/Bowel: No evidence of obstruction, inflammatory process or
abnormal fluid collections. Normal appendix visualized.
Diverticulosis is seen mainly involving the sigmoid colon, however
there is no evidence of diverticulitis.

Vascular/Lymphatic: No pathologically enlarged lymph nodes. No
abdominal aortic aneurysm.

Reproductive:  Mildly enlarged prostate.

Other:  None.

Musculoskeletal:  No suspicious bone lesions identified.
IMPRESSION: No acute findings within the abdomen or pelvis.

Colonic diverticulosis, without radiographic evidence of
diverticulitis.

Mildly enlarged prostate.

## 2022-01-14 DIAGNOSIS — H903 Sensorineural hearing loss, bilateral: Secondary | ICD-10-CM | POA: Diagnosis not present

## 2022-01-28 DIAGNOSIS — T148XXA Other injury of unspecified body region, initial encounter: Secondary | ICD-10-CM | POA: Diagnosis not present

## 2022-01-28 DIAGNOSIS — R432 Parageusia: Secondary | ICD-10-CM | POA: Diagnosis not present

## 2022-02-04 DIAGNOSIS — M79644 Pain in right finger(s): Secondary | ICD-10-CM | POA: Diagnosis not present

## 2022-03-19 ENCOUNTER — Ambulatory Visit: Payer: Medicare Other | Admitting: Physician Assistant

## 2022-04-01 ENCOUNTER — Ambulatory Visit: Payer: Medicare Other | Admitting: Physician Assistant

## 2022-05-04 DIAGNOSIS — R0789 Other chest pain: Secondary | ICD-10-CM | POA: Diagnosis not present

## 2022-05-19 DIAGNOSIS — K5792 Diverticulitis of intestine, part unspecified, without perforation or abscess without bleeding: Secondary | ICD-10-CM | POA: Diagnosis not present

## 2022-05-19 DIAGNOSIS — Z8719 Personal history of other diseases of the digestive system: Secondary | ICD-10-CM | POA: Diagnosis not present

## 2022-05-19 DIAGNOSIS — N401 Enlarged prostate with lower urinary tract symptoms: Secondary | ICD-10-CM | POA: Diagnosis not present

## 2022-07-08 DIAGNOSIS — R1033 Periumbilical pain: Secondary | ICD-10-CM | POA: Diagnosis not present

## 2022-07-08 DIAGNOSIS — K921 Melena: Secondary | ICD-10-CM | POA: Diagnosis not present

## 2022-07-08 DIAGNOSIS — K5732 Diverticulitis of large intestine without perforation or abscess without bleeding: Secondary | ICD-10-CM | POA: Diagnosis not present

## 2022-07-31 DIAGNOSIS — Z23 Encounter for immunization: Secondary | ICD-10-CM | POA: Diagnosis not present

## 2022-07-31 DIAGNOSIS — I10 Essential (primary) hypertension: Secondary | ICD-10-CM | POA: Diagnosis not present

## 2022-07-31 DIAGNOSIS — Z8719 Personal history of other diseases of the digestive system: Secondary | ICD-10-CM | POA: Diagnosis not present

## 2022-08-03 DIAGNOSIS — M25561 Pain in right knee: Secondary | ICD-10-CM | POA: Diagnosis not present

## 2022-08-05 DIAGNOSIS — K5732 Diverticulitis of large intestine without perforation or abscess without bleeding: Secondary | ICD-10-CM | POA: Diagnosis not present

## 2022-08-05 DIAGNOSIS — K2930 Chronic superficial gastritis without bleeding: Secondary | ICD-10-CM | POA: Diagnosis not present

## 2022-08-05 DIAGNOSIS — K921 Melena: Secondary | ICD-10-CM | POA: Diagnosis not present

## 2022-08-05 DIAGNOSIS — K2970 Gastritis, unspecified, without bleeding: Secondary | ICD-10-CM | POA: Diagnosis not present

## 2022-08-05 DIAGNOSIS — K5730 Diverticulosis of large intestine without perforation or abscess without bleeding: Secondary | ICD-10-CM | POA: Diagnosis not present

## 2022-08-05 DIAGNOSIS — K2289 Other specified disease of esophagus: Secondary | ICD-10-CM | POA: Diagnosis not present

## 2022-08-05 DIAGNOSIS — K640 First degree hemorrhoids: Secondary | ICD-10-CM | POA: Diagnosis not present

## 2022-08-05 DIAGNOSIS — D120 Benign neoplasm of cecum: Secondary | ICD-10-CM | POA: Diagnosis not present

## 2022-08-05 DIAGNOSIS — K644 Residual hemorrhoidal skin tags: Secondary | ICD-10-CM | POA: Diagnosis not present

## 2022-08-05 DIAGNOSIS — R1013 Epigastric pain: Secondary | ICD-10-CM | POA: Diagnosis not present

## 2022-08-05 DIAGNOSIS — K2100 Gastro-esophageal reflux disease with esophagitis, without bleeding: Secondary | ICD-10-CM | POA: Diagnosis not present

## 2022-08-05 DIAGNOSIS — K2980 Duodenitis without bleeding: Secondary | ICD-10-CM | POA: Diagnosis not present

## 2022-08-11 DIAGNOSIS — D120 Benign neoplasm of cecum: Secondary | ICD-10-CM | POA: Diagnosis not present

## 2022-08-11 DIAGNOSIS — K2930 Chronic superficial gastritis without bleeding: Secondary | ICD-10-CM | POA: Diagnosis not present

## 2022-08-12 DIAGNOSIS — M25561 Pain in right knee: Secondary | ICD-10-CM | POA: Diagnosis not present

## 2022-08-31 ENCOUNTER — Emergency Department (HOSPITAL_COMMUNITY): Payer: Medicare Other

## 2022-08-31 ENCOUNTER — Emergency Department (HOSPITAL_COMMUNITY)
Admission: EM | Admit: 2022-08-31 | Discharge: 2022-08-31 | Disposition: A | Discharge status: Home / Self Care | Payer: Medicare Other | Attending: Emergency Medicine | Admitting: Emergency Medicine

## 2022-08-31 ENCOUNTER — Other Ambulatory Visit: Payer: Self-pay

## 2022-08-31 ENCOUNTER — Encounter (HOSPITAL_COMMUNITY): Payer: Self-pay

## 2022-08-31 DIAGNOSIS — S6991XA Unspecified injury of right wrist, hand and finger(s), initial encounter: Secondary | ICD-10-CM | POA: Diagnosis not present

## 2022-08-31 DIAGNOSIS — W312XXA Contact with powered woodworking and forming machines, initial encounter: Secondary | ICD-10-CM | POA: Insufficient documentation

## 2022-08-31 DIAGNOSIS — S62524B Nondisplaced fracture of distal phalanx of right thumb, initial encounter for open fracture: Secondary | ICD-10-CM | POA: Insufficient documentation

## 2022-08-31 DIAGNOSIS — S62524A Nondisplaced fracture of distal phalanx of right thumb, initial encounter for closed fracture: Secondary | ICD-10-CM | POA: Diagnosis not present

## 2022-08-31 DIAGNOSIS — S62501B Fracture of unspecified phalanx of right thumb, initial encounter for open fracture: Secondary | ICD-10-CM

## 2022-08-31 DIAGNOSIS — S62521A Displaced fracture of distal phalanx of right thumb, initial encounter for closed fracture: Secondary | ICD-10-CM | POA: Diagnosis not present

## 2022-08-31 DIAGNOSIS — S61011A Laceration without foreign body of right thumb without damage to nail, initial encounter: Secondary | ICD-10-CM | POA: Diagnosis not present

## 2022-08-31 DIAGNOSIS — Y9389 Activity, other specified: Secondary | ICD-10-CM | POA: Diagnosis not present

## 2022-08-31 MED ORDER — TRAMADOL HCL 50 MG PO TABS
50.0000 mg | ORAL_TABLET | Freq: Once | ORAL | Status: AC
  Administered 2022-08-31: 50 mg via ORAL
  Filled 2022-08-31: qty 1

## 2022-08-31 MED ORDER — OXYCODONE-ACETAMINOPHEN 5-325 MG PO TABS
1.0000 | ORAL_TABLET | Freq: Once | ORAL | Status: DC
  Filled 2022-08-31: qty 1

## 2022-08-31 MED ORDER — LIDOCAINE HCL (PF) 1 % IJ SOLN
10.0000 mL | Freq: Once | INTRAMUSCULAR | Status: AC
Start: 2022-08-31 — End: 2022-08-31
  Administered 2022-08-31: 10 mL
  Filled 2022-08-31: qty 30

## 2022-08-31 MED ORDER — CEFAZOLIN SODIUM-DEXTROSE 2-4 GM/100ML-% IV SOLN
2.0000 g | Freq: Once | INTRAVENOUS | Status: AC
  Administered 2022-08-31: 2 g via INTRAVENOUS
  Filled 2022-08-31: qty 100

## 2022-08-31 MED ORDER — TRAMADOL HCL 50 MG PO TABS: 50.0000 mg | ORAL_TABLET | Freq: Four times a day (QID) | ORAL | 0 refills | Status: AC | PRN

## 2022-08-31 MED ORDER — CEPHALEXIN 500 MG PO CAPS: 500.0000 mg | ORAL_CAPSULE | Freq: Four times a day (QID) | ORAL | 0 refills | Status: AC

## 2022-08-31 NOTE — Progress Notes (Signed)
Orthopedic Tech Progress Note Patient Details:  Arthur Brady [birth year] [id number]  Ortho Devices Type of Ortho Device: Thumb velcro splint Ortho Device/Splint Location: RUE Ortho Device/Splint Interventions: Ordered, Application, Adjustment   Post Interventions Patient Tolerated: Well Instructions Provided: Care of device, Adjustment of device  [facility] 08/31/2022, 5:38 PM

## 2022-08-31 NOTE — Discharge Instructions (Addendum)
You were seen in the emergency department for your right thumb laceration.  X-rays that we obtained here show that your thumb is also broken and therefore we have placed you in a splint that you should wear at all times.  Given this is an open fracture, you will also need to take the antibiotics that I have prescribed for you as prescribed in its entirety for management of your symptoms.  I have also given you a referral to hand surgery to follow-up with.  Please call them to schedule an appointment tomorrow.  Additionally, I have given you a prescription for tramadol which was the pain medication you tolerated well in the emergency department today.  Please take this as prescribed as needed for severe pain only.  Do not drive or operate heavy machinery while taking this medication as it can be sedating.  We have closed your laceration(s) with sutures. These need to be removed in 7-10 days. This can be done at your follow-up appointment with the hand surgeon Dr. Greta Doom.  If any of the sutures come out before it is time for removal, that is okay. Make sure to keep the area as clean and dry as possible. You can let warm soapy warm run over the area, but do NOT scrub it.   Watch out for signs of infection, like we discussed, including: increased redness, tenderness, or drainage of pus from the area.   You can take over the counter pain medicine like ibuprofen or tylenol as needed.   Return if development of any new or worsening symptoms.

## 2022-08-31 NOTE — ED Provider Notes (Signed)
[place] EMERGENCY DEPARTMENT AT [facility] Provider Note   CSN: [id number] Arrival date & time: 08/31/22  1301     History  Chief Complaint  Patient presents with   Finger Laceration    Arthur Brady is a 82 y.o. male.  Patient with noncontributory past medical history presents today with complaints of right thumb injury.  He states that same occurred immediately prior to arrival today when he sliced his thumb with a table saw.  He endorses significant pain to same.  Denies any other injuries or complaints.  He is not anticoagulated.  He states that he is allergic to the tetanus shot and therefore cannot get Tdap.  The history is provided by the patient. No language interpreter was used.       Home Medications Prior to Admission medications   Medication Sig Start Date End Date Taking? Authorizing Provider  meclizine (ANTIVERT) 25 MG tablet Take 1 tablet (25 mg total) by mouth 3 (three) times daily as needed for dizziness. Patient not taking: No sig reported 01/10/15   Waynetta Pean, PA-C  ondansetron (ZOFRAN) 4 MG tablet Take 1 tablet (4 mg total) by mouth every 8 (eight) hours as needed for nausea or vomiting. Patient not taking: Reported on 03/19/2021 06/16/20   Charlann Lange, PA-C  pantoprazole (PROTONIX) 20 MG tablet Take 1 tablet (20 mg total) by mouth daily. Patient not taking: Reported on 03/19/2021 06/16/20   Charlann Lange, PA-C  valsartan (DIOVAN) 40 MG tablet Take 40 mg by mouth daily. 02/21/21   [provider]      Allergies    Codeine phosphate, Morphine and related, Penicillins, Tetanus toxoids, and Voltaren [diclofenac sodium]    Review of Systems   Review of Systems  Skin:  Positive for wound.  All other systems reviewed and are negative.   Physical Exam Updated Vital Signs BP (!) 167/98 (BP Location: Left Arm)   Pulse 95   Temp 98.2 F (36.8 C) (Oral)   Resp 20   Ht 5' 6.5" (1.689 m)   Wt 61.2 kg   SpO2 100%   BMI 21.46  kg/m  Physical Exam Vitals and nursing note reviewed.  Constitutional:      General: He is not in acute distress.    Appearance: Normal appearance. He is normal weight. He is not ill-appearing, toxic-appearing or diaphoretic.  HENT:     Head: Normocephalic and atraumatic.  Cardiovascular:     Rate and Rhythm: Normal rate.  Pulmonary:     Effort: Pulmonary effort is normal. No respiratory distress.  Musculoskeletal:        General: Normal range of motion.     Cervical back: Normal range of motion.  Skin:    General: Skin is warm and dry.     Comments: Deep laceration noted to the dorsal surface of the right thumb extending to the webspace on the medial side. Small particulate foreign bodies noted throughout. Bleeding controlled. No nailbed involvement. Capillary refill less than 2 seconds. See images below for further  Neurological:     General: No focal deficit present.     Mental Status: He is alert.  Psychiatric:        Mood and Affect: Mood normal.        Behavior: Behavior normal.        ED Results / Procedures / Treatments   Labs (all labs ordered are listed, but only abnormal results are displayed) Labs Reviewed - No data to display  EKG None  Radiology DG Finger Thumb Right  Result Date: 08/31/2022 CLINICAL DATA:  Right finger laceration EXAM: RIGHT THUMB 2+V COMPARISON:  Same day radiographs FINDINGS: Acute comminuted fractures involving the volar aspects of the base of the thumb distal phalanx as well as the distal portion of the right thumb proximal phalanx. Multiple small adjacent fracture fragments. Fractures extend intra-articularly to the IP joint. There is soft tissue swelling and irregularity. No dislocation. Mild osteoarthritic changes. No foreign body within the soft tissues. IMPRESSION: Acute comminuted fractures involving the volar aspects of the base of the thumb distal phalanx as well as the distal portion of the right thumb proximal phalanx.  Intra-articular involvement of the fractures compatible with traumatic arthrotomy. Electronically Signed   By: Davina Poke D.O.   On: 08/31/2022 14:55   DG Finger Thumb Right  Result Date: 08/31/2022 CLINICAL DATA:  Right thumb injury.  Laceration EXAM: RIGHT THUMB 2+V COMPARISON:  None Available. FINDINGS: Large amount of bandages overlying the right thumb. Difficult to exclude subtle fractures involving the distal phalanx near the IP joint on the oblique view of the thumb. No other evidence for fracture or dislocation. IMPRESSION: Limited evaluation of the thumb IP joint region. Cannot exclude a subtle fracture in this area and consider repeat imaging with removal of soft tissue bandages. Electronically Signed   By: Markus Daft M.D.   On: 08/31/2022 14:11    Procedures .Marland KitchenLaceration Repair  Date/Time: 08/31/2022 5:35 PM  Performed by: Bud Face, PA-C Authorized by: Bud Face, PA-C   Consent:    Consent obtained:  Verbal   Consent given by:  Patient   Risks, benefits, and alternatives were discussed: yes     Risks discussed:  Infection, need for additional repair, nerve damage, poor wound healing, poor cosmetic result, pain, retained foreign body, tendon damage and vascular damage   Alternatives discussed:  No treatment, delayed treatment, observation and referral Universal protocol:    Procedure explained and questions answered to patient or proxy's satisfaction: yes     Imaging studies available: yes     Patient identity confirmed:  Verbally with patient Anesthesia:    Anesthesia method:  Nerve block   Block location:  Base of the thumb   Block needle gauge:  25 G   Block anesthetic:  Lidocaine 1% w/o epi   Block technique:  Digital block   Block injection procedure:  Anatomic landmarks identified, introduced needle, incremental injection, anatomic landmarks palpated and negative aspiration for blood   Block outcome:  Anesthesia achieved Laceration details:    Location:   Finger   Finger location:  R thumb   Length (cm):  13   Depth (mm):  5 Exploration:    Hemostasis achieved with:  Direct pressure   Imaging obtained: x-ray     Wound exploration: wound explored through full range of motion and entire depth of wound visualized     Contaminated: yes   Treatment:    Wound cleansed with: betadine and saline.   Amount of cleaning:  Extensive   Irrigation solution:  Sterile water   Irrigation volume:  1 L   Irrigation method:  Pressure wash   Visualized foreign bodies/material removed: yes     Debridement:  Moderate Skin repair:    Repair method:  Sutures   Suture size:  3-0   Suture material:  Prolene   Suture technique:  Simple interrupted   Number of sutures:  17 Approximation:    Approximation:  Close Repair type:    Repair type:  Simple Post-procedure details:    Dressing:  Antibiotic ointment and non-adherent dressing   Procedure completion:  Tolerated well, no immediate complications     Medications Ordered in ED Medications  lidocaine (PF) (XYLOCAINE) 1 % injection 10 mL (has no administration in time range)  ceFAZolin (ANCEF) IVPB 2g/100 mL premix (has no administration in time range)  traMADol (ULTRAM) tablet 50 mg (50 mg Oral Given 08/31/22 1432)    ED Course/ Medical Decision Making/ A&P                             Medical Decision Making Amount and/or Complexity of Data Reviewed Radiology: ordered.  Risk Prescription drug management.   Patient presents today with complaints of right thumb laceration which occurred immediately prior to arrival today. He is afebrile, non-toxic appearing, and in no acute distress with reassuring vital signs. Physical exam reveals deep laceration to the dorsal aspect of the right thumb. See images above for further. X-ray imaging obtained and reveals  Acute comminuted fractures involving the volar aspects of the base of the thumb distal phalanx as well as the distal portion of the right thumb  proximal phalanx. Intra-articular involvement of the fractures compatible with traumatic arthrotomy.  I have personally reviewed and interpreted this imaging and agree with radiology interpretation.  Discussed with Hilbert Odor, PA-C with hand surgery on call who reviewed case with hand surgery Dr. Greta Doom, plan for ancef IV here, Keflex to go home with, laceration repair in the ER, and thumb spica splint with follow-up outpatient with Dr. Greta Doom.  Patient given ancef in ER today. Laceration repair per above procedure.   Pressure irrigation performed. Wound explored and base of wound visualized in a bloodless field with small particulates that have been debrided and removed.  Laceration occurred < 8 hours prior to repair which was well tolerated.  Patient unfortunately cannot receive Tdap due to an allergy.  Given open fracture and per hand surgery recommendations, patient given Keflex to go home with and hand surgery follow-up outpatient.  Placed in thumb spica splint and instructions for wound care given.  Discussed suture home care with patient and answered questions. Pt to follow-up for wound check and suture removal in 7 days; they are to return to the ED sooner for signs of infection. Pt is hemodynamically stable with no complaints prior to dc. Evaluation and diagnostic testing in the emergency department does not suggest an emergent condition requiring admission or immediate intervention beyond what has been performed at this time.  Plan for discharge with close PCP follow-up.  Patient is understanding and amenable with plan, educated on red flag symptoms that would prompt immediate return.  Patient discharged in stable condition.   Final Clinical Impression(s) / ED Diagnoses Final diagnoses:  Laceration of right thumb, initial encounter  Open nondisplaced fracture of phalanx of right thumb, unspecified phalanx, initial encounter    Rx / DC Orders ED Discharge Orders          Ordered     cephALEXin (KEFLEX) 500 MG capsule  4 times daily        08/31/22 1748    traMADol (ULTRAM) 50 MG tablet  Every 6 hours PRN        08/31/22 1752          An After Visit Summary was printed and given to the patient.     Dmarion Perfect,  Leary Roca, PA-C 08/31/22 1753    Blanchie Dessert, MD 09/01/22 [phone]

## 2022-08-31 NOTE — ED Triage Notes (Signed)
Patient was cutting wood and cut his right thumb with a saw. Said he is allergic to the tetanus shot. Bleeding, pressure applied.

## 2022-09-02 DIAGNOSIS — M79644 Pain in right finger(s): Secondary | ICD-10-CM | POA: Diagnosis not present

## 2022-09-04 ENCOUNTER — Telehealth: Payer: Self-pay

## 2022-09-04 NOTE — Telephone Encounter (Signed)
     Patient  visit on 3/4  at [facility]    Have you been able to follow up with your primary care physician? Yes   The patient was or was not able to obtain any needed medicine or equipment. Yes   Are there diet recommendations that you are having difficulty following? Na   Patient expresses understanding of discharge instructions and education provided has no other needs at this time.  Yes   .al

## 2022-09-09 DIAGNOSIS — M25641 Stiffness of right hand, not elsewhere classified: Secondary | ICD-10-CM | POA: Diagnosis not present

## 2022-09-09 DIAGNOSIS — M79644 Pain in right finger(s): Secondary | ICD-10-CM | POA: Diagnosis not present

## 2022-09-09 DIAGNOSIS — R1013 Epigastric pain: Secondary | ICD-10-CM | POA: Diagnosis not present

## 2022-09-09 DIAGNOSIS — K2100 Gastro-esophageal reflux disease with esophagitis, without bleeding: Secondary | ICD-10-CM | POA: Diagnosis not present

## 2022-09-17 DIAGNOSIS — M79644 Pain in right finger(s): Secondary | ICD-10-CM | POA: Diagnosis not present

## 2022-09-17 DIAGNOSIS — M25641 Stiffness of right hand, not elsewhere classified: Secondary | ICD-10-CM | POA: Diagnosis not present

## 2022-10-07 DIAGNOSIS — M65342 Trigger finger, left ring finger: Secondary | ICD-10-CM | POA: Diagnosis not present

## 2022-10-15 DIAGNOSIS — M79644 Pain in right finger(s): Secondary | ICD-10-CM | POA: Diagnosis not present

## 2022-11-27 DIAGNOSIS — R634 Abnormal weight loss: Secondary | ICD-10-CM | POA: Diagnosis not present

## 2022-11-27 DIAGNOSIS — Z0000 Encounter for general adult medical examination without abnormal findings: Secondary | ICD-10-CM | POA: Diagnosis not present

## 2022-11-27 DIAGNOSIS — E559 Vitamin D deficiency, unspecified: Secondary | ICD-10-CM | POA: Diagnosis not present

## 2023-03-18 DIAGNOSIS — L578 Other skin changes due to chronic exposure to nonionizing radiation: Secondary | ICD-10-CM | POA: Diagnosis not present

## 2023-03-18 DIAGNOSIS — L814 Other melanin hyperpigmentation: Secondary | ICD-10-CM | POA: Diagnosis not present

## 2023-03-18 DIAGNOSIS — D229 Melanocytic nevi, unspecified: Secondary | ICD-10-CM | POA: Diagnosis not present

## 2023-03-18 DIAGNOSIS — L570 Actinic keratosis: Secondary | ICD-10-CM | POA: Diagnosis not present

## 2023-03-18 DIAGNOSIS — D485 Neoplasm of uncertain behavior of skin: Secondary | ICD-10-CM | POA: Diagnosis not present

## 2023-03-18 DIAGNOSIS — L821 Other seborrheic keratosis: Secondary | ICD-10-CM | POA: Diagnosis not present

## 2023-07-02 DIAGNOSIS — K080 Exfoliation of teeth due to systemic causes: Secondary | ICD-10-CM | POA: Diagnosis not present

## 2023-09-21 DIAGNOSIS — K080 Exfoliation of teeth due to systemic causes: Secondary | ICD-10-CM | POA: Diagnosis not present

## 2023-10-02 ENCOUNTER — Emergency Department (HOSPITAL_COMMUNITY)
Admission: EM | Admit: 2023-10-02 | Discharge: 2023-10-02 | Disposition: A | Discharge status: Home / Self Care | Attending: Emergency Medicine | Admitting: Emergency Medicine

## 2023-10-02 ENCOUNTER — Emergency Department (HOSPITAL_COMMUNITY)

## 2023-10-02 ENCOUNTER — Other Ambulatory Visit: Payer: Self-pay

## 2023-10-02 ENCOUNTER — Encounter (HOSPITAL_COMMUNITY): Payer: Self-pay | Admitting: Emergency Medicine

## 2023-10-02 DIAGNOSIS — K5730 Diverticulosis of large intestine without perforation or abscess without bleeding: Secondary | ICD-10-CM | POA: Diagnosis not present

## 2023-10-02 DIAGNOSIS — R1084 Generalized abdominal pain: Secondary | ICD-10-CM | POA: Diagnosis not present

## 2023-10-02 DIAGNOSIS — R509 Fever, unspecified: Secondary | ICD-10-CM | POA: Diagnosis not present

## 2023-10-02 DIAGNOSIS — R911 Solitary pulmonary nodule: Secondary | ICD-10-CM | POA: Insufficient documentation

## 2023-10-02 DIAGNOSIS — R109 Unspecified abdominal pain: Secondary | ICD-10-CM | POA: Diagnosis not present

## 2023-10-02 DIAGNOSIS — R918 Other nonspecific abnormal finding of lung field: Secondary | ICD-10-CM | POA: Diagnosis not present

## 2023-10-02 DIAGNOSIS — R6883 Chills (without fever): Secondary | ICD-10-CM | POA: Diagnosis not present

## 2023-10-02 DIAGNOSIS — R9431 Abnormal electrocardiogram [ECG] [EKG]: Secondary | ICD-10-CM | POA: Diagnosis not present

## 2023-10-02 DIAGNOSIS — R1010 Upper abdominal pain, unspecified: Secondary | ICD-10-CM | POA: Diagnosis not present

## 2023-10-02 DIAGNOSIS — R5383 Other fatigue: Secondary | ICD-10-CM | POA: Diagnosis not present

## 2023-10-02 DIAGNOSIS — K7689 Other specified diseases of liver: Secondary | ICD-10-CM | POA: Diagnosis not present

## 2023-10-02 LAB — CBC
HCT: 38.4 % — ABNORMAL LOW (ref 39.0–52.0)
Hemoglobin: 12.6 g/dL — ABNORMAL LOW (ref 13.0–17.0)
MCH: 30.2 pg (ref 26.0–34.0)
MCHC: 32.8 g/dL (ref 30.0–36.0)
MCV: 92.1 fL (ref 80.0–100.0)
Platelets: 155 10*3/uL (ref 150–400)
RBC: 4.17 MIL/uL — ABNORMAL LOW (ref 4.22–5.81)
RDW: 13.4 % (ref 11.5–15.5)
WBC: 8.8 10*3/uL (ref 4.0–10.5)
nRBC: 0 % (ref 0.0–0.2)

## 2023-10-02 LAB — COMPREHENSIVE METABOLIC PANEL WITH GFR
ALT: 14 U/L (ref 0–44)
AST: 23 U/L (ref 15–41)
Albumin: 3.9 g/dL (ref 3.5–5.0)
Alkaline Phosphatase: 36 U/L — ABNORMAL LOW (ref 38–126)
Anion gap: 9 (ref 5–15)
BUN: 12 mg/dL (ref 8–23)
CO2: 20 mmol/L — ABNORMAL LOW (ref 22–32)
Calcium: 9 mg/dL (ref 8.9–10.3)
Chloride: 105 mmol/L (ref 98–111)
Creatinine, Ser: 1.01 mg/dL (ref 0.61–1.24)
GFR, Estimated: >60 mL/min (ref >60)
Glucose, Bld: 110 mg/dL — ABNORMAL HIGH (ref 70–99)
Potassium: 3.5 mmol/L (ref 3.5–5.1)
Sodium: 134 mmol/L — ABNORMAL LOW (ref 135–145)
Total Bilirubin: 1.1 mg/dL (ref 0.0–1.2)
Total Protein: 6.4 g/dL — ABNORMAL LOW (ref 6.5–8.1)

## 2023-10-02 LAB — URINALYSIS, ROUTINE W REFLEX MICROSCOPIC
Bilirubin Urine: NEGATIVE
Glucose, UA: NEGATIVE mg/dL
Hgb urine dipstick: NEGATIVE
Ketones, ur: NEGATIVE mg/dL
Leukocytes,Ua: NEGATIVE
Nitrite: NEGATIVE
Protein, ur: NEGATIVE mg/dL
Specific Gravity, Urine: 1.015 (ref 1.005–1.030)
pH: 7 (ref 5.0–8.0)

## 2023-10-02 LAB — CBG MONITORING, ED: Glucose-Capillary: 114 mg/dL — ABNORMAL HIGH (ref 70–99)

## 2023-10-02 LAB — RESP PANEL BY RT-PCR (RSV, FLU A&B, COVID)  RVPGX2
Influenza A by PCR: NEGATIVE
Influenza B by PCR: NEGATIVE
Resp Syncytial Virus by PCR: NEGATIVE
SARS Coronavirus 2 by RT PCR: NEGATIVE

## 2023-10-02 LAB — LIPASE, BLOOD: Lipase: 28 U/L (ref 11–51)

## 2023-10-02 LAB — TROPONIN I (HIGH SENSITIVITY)
Troponin I (High Sensitivity): 7 ng/L (ref <18)
Troponin I (High Sensitivity): 6 ng/L (ref <18)

## 2023-10-02 MED ORDER — LACTATED RINGERS IV BOLUS
1000.0000 mL | Freq: Once | INTRAVENOUS | Status: AC
  Administered 2023-10-02: 1000 mL via INTRAVENOUS

## 2023-10-02 MED ORDER — ACETAMINOPHEN 500 MG PO TABS
1000.0000 mg | ORAL_TABLET | Freq: Once | ORAL | Status: AC
  Administered 2023-10-02: 1000 mg via ORAL
  Filled 2023-10-02: qty 2

## 2023-10-02 MED ORDER — IOHEXOL 350 MG/ML SOLN
100.0000 mL | Freq: Once | INTRAVENOUS | Status: AC | PRN
  Administered 2023-10-02: 100 mL via INTRAVENOUS

## 2023-10-02 NOTE — ED Triage Notes (Signed)
 Patient came in from urgent care today. This am while working on a deck, he became suddenly weak, had chills and abdominal pian. He was helped back into the house to lay down. MD at urgent care is concerned that he may have a tear in his aorta or acute pancreatitis.

## 2023-10-02 NOTE — ED Provider Notes (Signed)
 [place] EMERGENCY DEPARTMENT AT [facility] Provider Note   CSN: [id number] Arrival date & time: 10/02/23  1311     History Chief Complaint  Patient presents with   Abdominal Pain   Weakness    HPI SHARROD ACHILLE is a 83 y.o. male presenting for relatively severe onset abdominal pain and fever. Woke up this morning feeling relatively well.  Feeling the building deck for a neighbor.  Acutely states that he became lightheaded and weak so having severe abdominal pain. Endorses fevers and chills that worsened today.  No symptoms earlier in the day.  There is nausea without vomiting or diarrhea.  Patient's recorded medical, surgical, social, medication list and allergies were reviewed in the Snapshot window as part of the initial history.   Review of Systems   Review of Systems  Constitutional:  Positive for fever. Negative for chills.  HENT:  Negative for ear pain and sore throat.   Eyes:  Negative for pain and visual disturbance.  Respiratory:  Negative for cough and shortness of breath.   Cardiovascular:  Negative for chest pain and palpitations.  Gastrointestinal:  Positive for abdominal pain and nausea. Negative for vomiting.  Genitourinary:  Negative for dysuria and hematuria.  Musculoskeletal:  Negative for arthralgias and back pain.  Skin:  Negative for color change and rash.  Neurological:  Positive for weakness. Negative for seizures and syncope.  All other systems reviewed and are negative.   Physical Exam Updated Vital Signs BP 114/65   Pulse 73   Temp 98.2 F (36.8 C)   Resp 18   SpO2 98%  Physical Exam Vitals and nursing note reviewed.  Constitutional:      General: He is not in acute distress.    Appearance: He is well-developed.  HENT:     Head: Normocephalic and atraumatic.  Eyes:     Conjunctiva/sclera: Conjunctivae normal.  Cardiovascular:     Rate and Rhythm: Normal rate and regular rhythm.     Heart sounds: No murmur  heard. Pulmonary:     Effort: Pulmonary effort is normal. No respiratory distress.     Breath sounds: Normal breath sounds.  Abdominal:     Palpations: Abdomen is soft.     Tenderness: There is generalized abdominal tenderness and tenderness in the epigastric area.  Musculoskeletal:        General: No swelling.     Cervical back: Neck supple.  Skin:    General: Skin is warm and dry.     Capillary Refill: Capillary refill takes less than 2 seconds.  Neurological:     Mental Status: He is alert.  Psychiatric:        Mood and Affect: Mood normal.      ED Course/ Medical Decision Making/ A&P    Procedures Procedures   Medications Ordered in ED Medications  acetaminophen (TYLENOL) tablet 1,000 mg (1,000 mg Oral Given 10/02/23 1400)  lactated ringers bolus 1,000 mL (0 mLs Intravenous Stopped 10/02/23 1533)  iohexol (OMNIPAQUE) 350 MG/ML injection 100 mL (100 mLs Intravenous Contrast Given 10/02/23 1459)   Medical Decision Making:   PAXTYN BOYAR is a 83 y.o. male who presented to the ED today with abdominal pain, detailed above.    Additional history discussed with patient's family/caregivers.  Patient placed on continuous vitals and telemetry monitoring while in ED which was reviewed periodically.  Complete initial physical exam performed, notably the patient  was hemodynamically stable no acute distress.     Reviewed  and confirmed nursing documentation for past medical history, family history, social history.    Initial Assessment:   With the patient's presentation of abdominal pain, most likely diagnosis is nonspecific etiology. Other diagnoses were considered including (but not limited to) gastroenteritis, colitis, small bowel obstruction, appendicitis, cholecystitis, pancreatitis, nephrolithiasis, UTI, pyleonephritis These are considered less likely due to history of present illness and physical exam findings.   This is most consistent with an acute life/limb threatening illness  complicated by underlying chronic conditions.   Initial Plan:  CBC/CMP to evaluate for underlying infectious/metabolic etiology for patient's abdominal pain  Lipase to evaluate for pancreatitis  Serial troponin/EKG to evaluate for cardiac source of pain  CT Ch/AB/Pelvis with contrast to evaluate for structural/surgical etiology of patients' severe abdominal pain.  Urinalysis and repeat physical assessment to evaluate for UTI/Pyelonpehritis  Empiric management of symptoms with escalating pain control and antiemetics as needed.   Initial Study Results:   Laboratory  All laboratory results reviewed without evidence of clinically relevant pathology.    EKG EKG was reviewed independently. Rate, rhythm, axis, intervals all examined and without medically relevant abnormality. ST segments without concerns for elevations.    Radiology All images reviewed independently. Agree with radiology report at this time.   CT ANGIO CHEST/ABD/PEL FOR DISSECTION W &/OR WO CONTRAST Result Date: 10/02/2023 CLINICAL DATA:  Weakness, abdominal pain and chills. Concern for acute aortic syndrome. EXAM: CT ANGIOGRAPHY CHEST, ABDOMEN AND PELVIS TECHNIQUE: Non-contrast CT of the chest was initially obtained. Multidetector CT imaging through the chest, abdomen and pelvis was performed using the standard protocol during bolus administration of intravenous contrast. Multiplanar reconstructed images and MIPs were obtained and reviewed to evaluate the vascular anatomy. RADIATION DOSE REDUCTION: This exam was performed according to the departmental dose-optimization program which includes automated exposure control, adjustment of the mA and/or kV according to patient size and/or use of iterative reconstruction technique. CONTRAST:  OMNIPAQUE IOHEXOL 350 MG/ML SOLN COMPARISON:  CT abdomen pelvis dated 10/09/2021. FINDINGS: CTA CHEST FINDINGS Cardiovascular: There is no cardiomegaly or pericardial effusion. The thoracic aorta is  unremarkable. The origins of the great vessels of the aortic arch appear patent. No pulmonary artery emboli noted. Mediastinum/Nodes: No hilar or mediastinal adenopathy. The esophagus is grossly unremarkable. No mediastinal fluid collection. Lungs/Pleura: No focal consolidation, pleural effusion or pneumothorax. The central airways are patent. A 6 mm right middle lobe nodule. Musculoskeletal: No acute osseous pathology. Review of the MIP images confirms the above findings. CTA ABDOMEN AND PELVIS FINDINGS VASCULAR Aorta: Normal caliber aorta without aneurysm, dissection, vasculitis or significant stenosis. Celiac: Patent without evidence of aneurysm, dissection, vasculitis or significant stenosis. SMA: Patent without evidence of aneurysm, dissection, vasculitis or significant stenosis. Renals: Both renal arteries are patent without evidence of aneurysm, dissection, vasculitis, fibromuscular dysplasia or significant stenosis. IMA: Patent without evidence of aneurysm, dissection, vasculitis or significant stenosis. Inflow: Patent without evidence of aneurysm, dissection, vasculitis or significant stenosis. Veins: No obvious venous abnormality within the limitations of this arterial phase study. Review of the MIP images confirms the above findings. NON-VASCULAR No intra-abdominal free air or free fluid. Hepatobiliary: Multiple small liver cysts and additional subcentimeter hypodense lesions which are too small to characterize but similar to prior CT. No biliary dilatation. The gallbladder is unremarkable Pancreas: Unremarkable. No pancreatic ductal dilatation or surrounding inflammatory changes. Spleen: Normal in size without focal abnormality. Adrenals/Urinary Tract: The adrenal glands unremarkable. The kidneys, visualized ureters, and urinary bladder appear unremarkable Stomach/Bowel: There is sigmoid diverticulosis. There is  no bowel obstruction or active inflammation. The appendix is normal. Lymphatic: No  adenopathy. Reproductive: The prostate and seminal vesicles are grossly remarkable. Other: None Musculoskeletal: Osteopenia with degenerative changes of the spine. Lower lumbar posterior fusion and laminectomy. No acute osseous pathology. Review of the MIP images confirms the above findings. IMPRESSION: 1. No acute intrathoracic, abdominal, or pelvic pathology. No aortic aneurysm or dissection. 2. Sigmoid diverticulosis. No bowel obstruction. Normal appendix. 3. A 6 mm right middle lobe nodule. Non-contrast chest CT at 6-12 months is recommended. If the nodule is stable at time of repeat CT, then future CT at 18-24 months (from today's scan) is considered optional for low-risk patients, but is recommended for high-risk patients. This recommendation follows the consensus statement: Guidelines for Management of Incidental Pulmonary Nodules Detected on CT Images: From the Fleischner Society 2017; Radiology 2017; 284:228-243. Electronically Signed   By: Elgie Collard M.D.   On: 10/02/2023 15:34   Final Reassessment and Plan:   Patient's history of present illness physical exam findings revealed no focal pathology with the current ordered studies. No dissection, no acute infection, no obstruction.  He is now ambulatory tolerating p.o. intake in no acute distress. Otherwise lab work with no focal pathology.  Fever resolved with Tylenol.  Long conversation with the patient extensive observation window, they feel comfortable with discharge with plan to follow-up with PCP on Monday or return if patient has interval worsening such as clinical overlap with early appendicitis or cholecystitis.  Patient ambulatory tolerating p.o. intake, at time of discharge strict return precautions reinforced. Disposition:   Based on the above findings, I believe this patient is stable for admission.    Patient/family educated about specific findings on our evaluation and explained exact reasons for admission.  Patient/family  educated about clinical situation and time was allowed to answer questions.   Admission team communicated with and agreed with need for admission. Patient admitted. Patient ready to move at this time.     Emergency Department Medication Summary:   Medications  acetaminophen (TYLENOL) tablet 1,000 mg (1,000 mg Oral Given 10/02/23 1400)  lactated ringers bolus 1,000 mL (0 mLs Intravenous Stopped 10/02/23 1533)  iohexol (OMNIPAQUE) 350 MG/ML injection 100 mL (100 mLs Intravenous Contrast Given 10/02/23 1459)     Clinical Impression:  1. Generalized abdominal pain      Discharge   Final Clinical Impression(s) / ED Diagnoses Final diagnoses:  Generalized abdominal pain    Rx / DC Orders ED Discharge Orders     None         Glyn Ade, MD 10/02/23 2306

## 2023-10-04 DIAGNOSIS — K219 Gastro-esophageal reflux disease without esophagitis: Secondary | ICD-10-CM | POA: Diagnosis not present

## 2023-10-04 DIAGNOSIS — R911 Solitary pulmonary nodule: Secondary | ICD-10-CM | POA: Diagnosis not present

## 2023-10-04 DIAGNOSIS — D649 Anemia, unspecified: Secondary | ICD-10-CM | POA: Diagnosis not present

## 2023-11-03 DIAGNOSIS — D649 Anemia, unspecified: Secondary | ICD-10-CM | POA: Diagnosis not present

## 2023-11-25 DIAGNOSIS — K080 Exfoliation of teeth due to systemic causes: Secondary | ICD-10-CM | POA: Diagnosis not present

## 2023-12-01 DIAGNOSIS — I10 Essential (primary) hypertension: Secondary | ICD-10-CM | POA: Diagnosis not present

## 2023-12-01 DIAGNOSIS — N401 Enlarged prostate with lower urinary tract symptoms: Secondary | ICD-10-CM | POA: Diagnosis not present

## 2023-12-01 DIAGNOSIS — Z0000 Encounter for general adult medical examination without abnormal findings: Secondary | ICD-10-CM | POA: Diagnosis not present

## 2023-12-01 DIAGNOSIS — E559 Vitamin D deficiency, unspecified: Secondary | ICD-10-CM | POA: Diagnosis not present

## 2023-12-01 DIAGNOSIS — H8110 Benign paroxysmal vertigo, unspecified ear: Secondary | ICD-10-CM | POA: Diagnosis not present

## 2023-12-01 DIAGNOSIS — K219 Gastro-esophageal reflux disease without esophagitis: Secondary | ICD-10-CM | POA: Diagnosis not present

## 2023-12-01 DIAGNOSIS — Z125 Encounter for screening for malignant neoplasm of prostate: Secondary | ICD-10-CM | POA: Diagnosis not present

## 2023-12-02 ENCOUNTER — Other Ambulatory Visit: Payer: Self-pay | Admitting: Internal Medicine

## 2023-12-02 DIAGNOSIS — R1011 Right upper quadrant pain: Secondary | ICD-10-CM

## 2023-12-03 ENCOUNTER — Ambulatory Visit
Admission: RE | Admit: 2023-12-03 | Discharge: 2023-12-03 | Disposition: A | Discharge status: Home / Self Care | Source: Ambulatory Visit | Attending: Internal Medicine | Admitting: Internal Medicine

## 2023-12-03 DIAGNOSIS — R1011 Right upper quadrant pain: Secondary | ICD-10-CM | POA: Diagnosis not present

## 2023-12-03 DIAGNOSIS — K7689 Other specified diseases of liver: Secondary | ICD-10-CM | POA: Diagnosis not present

## 2023-12-14 ENCOUNTER — Ambulatory Visit: Attending: Internal Medicine

## 2023-12-14 ENCOUNTER — Other Ambulatory Visit: Payer: Self-pay

## 2023-12-14 DIAGNOSIS — H8113 Benign paroxysmal vertigo, bilateral: Secondary | ICD-10-CM | POA: Diagnosis not present

## 2023-12-14 DIAGNOSIS — R42 Dizziness and giddiness: Secondary | ICD-10-CM | POA: Diagnosis not present

## 2023-12-14 NOTE — Therapy (Signed)
 OUTPATIENT PHYSICAL THERAPY VESTIBULAR EVALUATION     Patient Name: Arthur Brady MRN: [id number] DOB:Mar 10, 1941, 83 y.o., male Today's Date: 12/14/2023  END OF SESSION:  PT End of Session - 12/14/23 0805     Visit Number 1    Number of Visits 8    Date for PT Re-Evaluation 01/11/24    Authorization Type [employer] Medicare    Progress Note Due on Visit 10    PT Start Time 0805    PT Stop Time 0900    PT Time Calculation (min) 55 min          Past Medical History:  Diagnosis Date   Abdominal pain    Chronic kidney disease    has had kidney stones   DJD (degenerative joint disease) of knee    GERD (gastroesophageal reflux disease)    Lumbar degenerative disc disease    Malignant hyperthermia    NSAID long-term use    NSAID long-term use    Pulmonary embolism, iatrogenic (HCC)    Shortness of breath    Past Surgical History:  Procedure Laterality Date   arthroscopy of knee left     BACK SURGERY     Patient Active Problem List   Diagnosis Date Noted   Hemiplegia of dominant side (HCC) 04/11/2012   Eye pain 04/11/2012   Rash 04/11/2012   Abdominal pain 05/08/2011   Hydronephrosis, left 05/08/2011   NSAID long-term use     PCP: Benedetta Bradley, MD REFERRING PROVIDER: Benedetta Bradley, MD  REFERRING DIAG: H81.10 (ICD-10-CM) - Benign paroxysmal vertigo, unspecified ear  THERAPY DIAG:  Dizziness and giddiness  BPPV (benign paroxysmal positional vertigo), bilateral  ONSET DATE: early May  Rationale for Evaluation and Treatment: Rehabilitation  SUBJECTIVE:   SUBJECTIVE STATEMENT: Has had dizziness ever since season changed--every time the barometric pressure changes I get dizzy--I've lived with that a long time.  Notes this episode has been more intense than his usual episodes. Experiencing spinning sensation--almost a blackout. Has HOH/tinnitus but this is not new. 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  Notes when rolling out of bed to the left this triggers vertigo. Has been having neck pain lately. Has been taking Meclizine  which helps suppress symptoms but leaves him feeling drowsy Pt accompanied by: significant other  PERTINENT HISTORY:   PAIN:  Are you having pain? Yes: NPRS scale: 3-4/10 Pain location: left cervical column Pain description: ache/sore Aggravating factors: unsure Relieving factors:    PRECAUTIONS: None  RED FLAGS: None   WEIGHT BEARING RESTRICTIONS: No  FALLS: Has patient fallen in last 6 months? No  LIVING ENVIRONMENT: Lives with: lives with their spouse Lives in: House/apartment-one level home Stairs: stairs to enter Has following equipment at home: None  PLOF: Independent  PATIENT GOALS:   OBJECTIVE:  Note: Objective measures were completed at Evaluation unless otherwise noted.  DIAGNOSTIC FINDINGS:   COGNITION: Overall cognitive status: Within functional limits for tasks assessed   SENSATION: Not tested  EDEMA:     POSTURE:  No Significant postural limitations  Cervical ROM:    Active A/PROM (deg) eval  Flexion 50  Extension 40  Right lateral flexion 45  Left lateral flexion 45  Right rotation 45  Left rotation 35  (Blank rows = not tested)  STRENGTH:   LOWER EXTREMITY MMT:   MMT Right eval Left eval  Hip flexion    Hip abduction    Hip adduction  Hip internal rotation    Hip external rotation    Knee flexion    Knee extension    Ankle dorsiflexion    Ankle plantarflexion    Ankle inversion    Ankle eversion    (Blank rows = not tested)  BED MOBILITY:  indep  TRANSFERS: indep    GAIT: Gait pattern: unsteady due to symptoms Distance walked:  Assistive device utilized: None Level of assistance: Complete Independence Comments:     PATIENT SURVEYS:  DHI 78%  VESTIBULAR ASSESSMENT:  GENERAL OBSERVATION: wears readers   SYMPTOM BEHAVIOR:  Subjective history: month+  positional dizziness  Non-Vestibular symptoms: neck pain  Type of dizziness: Imbalance (Disequilibrium) and Spinning/Vertigo  Frequency: daily  Duration: seconds-minutes  Aggravating factors: Induced by position change: lying supine, rolling to the right, rolling to the left, supine to sit, and sit to stand and Induced by motion: looking up at the ceiling, bending down to the ground, turning body quickly, and turning head quickly  Relieving factors: closing eyes and slow movements  Progression of symptoms: unchanged  OCULOMOTOR EXAM:  Ocular Alignment: normal  Ocular ROM: No Limitations  Spontaneous Nystagmus: absent  Gaze-Induced Nystagmus: absent  Smooth Pursuits: intact  Saccades: slow  Convergence/Divergence: 8 cm  Symptomatic with left gaze activities/movements   VESTIBULAR - OCULAR REFLEX:   Slow VOR: Comment: symptomatic horizontal > vertical  VOR Cancellation: Normal  Head-Impulse Test: HIT Right: negative HIT Left: negative  Dynamic Visual Acuity:     POSITIONAL TESTING: Right Dix-Hallpike: unable to test Left Dix-Hallpike: upbeating, left nystagmus Right Roll Test: no nystagmus Left Roll Test: upbeating left  MOTION SENSITIVITY:  Motion Sensitivity Quotient Intensity: 0 = none, 1 = Lightheaded, 2 = Mild, 3 = Moderate, 4 = Severe, 5 = Vomiting  Intensity  1. Sitting to supine   2. Supine to L side   3. Supine to R side   4. Supine to sitting   5. L Hallpike-Dix   6. Up from L    7. R Hallpike-Dix   8. Up from R    9. Sitting, head tipped to L knee   10. Head up from L knee   11. Sitting, head tipped to R knee   12. Head up from R knee   13. Sitting head turns x5   14.Sitting head nods x5   15. In stance, 180 turn to L    16. In stance, 180 turn to R     OTHOSTATICS: not done  FUNCTIONAL GAIT: Functional gait assessment: TBD  M-CTSIB: TBD                                                                                                                             TREATMENT DATE: 12/14/23   Canalith Repositioning:  Epley Left: Number of Reps: 2 remains symptomatic--suspect right side involved as well   PATIENT EDUCATION: Education details: assessment details, rationale of intervention, education re: BPPV Person educated: Patient  and Spouse Education method: Explanation and Handouts Education comprehension: verbalized understanding and needs further education  HOME EXERCISE PROGRAM:  GOALS: Goals reviewed with patient? Yes  SHORT TERM GOALS: Target date: same as LTG    LONG TERM GOALS: Target date: 01/11/2024    The patient will be independent with HEP for gaze adaptation, habituation, balance, and general mobility. Baseline:  Goal status: INITIAL  2.  Report improved symptoms/quality of life per DHI score 60% Baseline: 78% Goal status: INITIAL  3.  Pt to be free of positional dizziness to reduce risk for falls/unsteadiness Baseline: +Left Dix-Hallpike (possibly right as well?) Goal status: INITIAL  4.  Demo improved balance and low risk for falls per score 25/30 Functional Gait Assessment Baseline: TBD Goal status: INITIAL  5.  Demo improved postural stability per mild sway x 30 sec condition 4 M-CTSIB to improve safety with ADL Baseline: TBD Goal status: INITIAL  6  ASSESSMENT:  CLINICAL IMPRESSION: Patient is a 83 y.o. male who was seen today for physical therapy evaluation and treatment for BPPV. Symptomatic/discomfort to left gaze with saccades and smooth pursuits, no abnormalities noted. Symptomatic to VOR in horizontal > vertical. Symptoms greatly limiting daily life per score 78% DHI and exhibits +Left Dix-Hallpike position w/ left upbeating nystagmus x 20 sec and reproduction of symptoms.  Initiated treatment with two repetitions of left Epley maneuver and pt remaining symptomatic post-tx. Suspect he may also have involvement on the right side, but unable to test due to time constraints and proceeding with  intervention for left BPPV.  Continued sessions indicated to address symptoms and improve balance/mobility/activity tolerance for return to PLOF  OBJECTIVE IMPAIRMENTS: Abnormal gait, decreased activity tolerance, decreased balance, difficulty walking, dizziness, and pain.   ACTIVITY LIMITATIONS: carrying, lifting, bending, bed mobility, reach over head, and locomotion level  PARTICIPATION LIMITATIONS: meal prep, cleaning, laundry, driving, shopping, community activity, and yard work  PERSONAL FACTORS: Age, Time since onset of injury/illness/exacerbation, and 1-2 comorbidities: PMH are also affecting patient's functional outcome.   REHAB POTENTIAL: Excellent  CLINICAL DECISION MAKING: Stable/uncomplicated  EVALUATION COMPLEXITY: Low   PLAN:  PT FREQUENCY: 1-2x/week  PT DURATION: 4 weeks  PLANNED INTERVENTIONS: 97750- Physical Performance Testing, 97110-Therapeutic exercises, 97530- Therapeutic activity, [id number]- Neuromuscular re-education, 97535- Self Care, [id number]- Manual therapy, [id number]- Gait training, [phone]- Canalith repositioning, [id number]- Aquatic Therapy, [id number]- Electrical stimulation (unattended), and 97012- Traction (mechanical)  PLAN FOR NEXT SESSION: check for right Dix-Hallpike, re-check left Dix-Hallpike, FGA, M-CTSIB, HEP   9:21 AM, 12/14/23 M. Kelly Blayklee Mable, PT, DPT Physical Therapist- [place] Office Number: [phone]

## 2023-12-17 ENCOUNTER — Ambulatory Visit

## 2023-12-17 DIAGNOSIS — H8113 Benign paroxysmal vertigo, bilateral: Secondary | ICD-10-CM | POA: Diagnosis not present

## 2023-12-17 DIAGNOSIS — R42 Dizziness and giddiness: Secondary | ICD-10-CM | POA: Diagnosis not present

## 2023-12-17 NOTE — Therapy (Signed)
 OUTPATIENT PHYSICAL THERAPY VESTIBULAR TREATMENT     Patient Name: Arthur Brady MRN: [id number] DOB:Dec 19, 1940, 83 y.o., male Today's Date: 12/17/2023  END OF SESSION:  PT End of Session - 12/17/23 0941     Visit Number 2    Number of Visits 8    Date for PT Re-Evaluation 01/11/24    Authorization Type BCBS Medicare    Progress Note Due on Visit 10    PT Start Time 0930    PT Stop Time 1015    PT Time Calculation (min) 45 min          Past Medical History:  Diagnosis Date   Abdominal pain    Chronic kidney disease    has had kidney stones   DJD (degenerative joint disease) of knee    GERD (gastroesophageal reflux disease)    Lumbar degenerative disc disease    Malignant hyperthermia    NSAID long-term use    NSAID long-term use    Pulmonary embolism, iatrogenic (HCC)    Shortness of breath    Past Surgical History:  Procedure Laterality Date   arthroscopy of knee left     BACK SURGERY     Patient Active Problem List   Diagnosis Date Noted   Hemiplegia of dominant side (HCC) 04/11/2012   Eye pain 04/11/2012   Rash 04/11/2012   Abdominal pain 05/08/2011   Hydronephrosis, left 05/08/2011   NSAID long-term use     PCP: Benedetta Bradley, MD REFERRING PROVIDER: Benedetta Bradley, MD  REFERRING DIAG: H81.10 (ICD-10-CM) - Benign paroxysmal vertigo, unspecified ear  THERAPY DIAG:  Dizziness and giddiness  BPPV (benign paroxysmal positional vertigo), bilateral  ONSET DATE: early May  Rationale for Evaluation and Treatment: Rehabilitation  SUBJECTIVE:   SUBJECTIVE STATEMENT: Had a couple of good days but the dizziness has come back. Neck is feeling tighter Pt accompanied by: significant other  PERTINENT HISTORY:   PAIN:  Are you having pain? Yes: NPRS scale: 3-4/10 Pain location: left cervical column Pain description: ache/sore Aggravating factors: unsure Relieving factors:    PRECAUTIONS: None  RED FLAGS: None   WEIGHT BEARING  RESTRICTIONS: No  FALLS: Has patient fallen in last 6 months? No  LIVING ENVIRONMENT: Lives with: lives with their spouse Lives in: House/apartment-one level home Stairs: stairs to enter Has following equipment at home: None  PLOF: Independent  PATIENT GOALS:   OBJECTIVE:   TODAY'S TREATMENT: 12/17/23 Activity Comments  Right sidelying No nystagmus, minimal dizziness  Left Dix-Hallpike-loaded Small ampitude left upbeating x 12 sec  Left Epley   Left dix-hallpike-loaded No dizziness/no nystagmus  M-CTSIB See below  Standing VOR x 1      M-CTSIB  Condition 1: Firm Surface, EO 30 Sec, Normal Sway  Condition 2: Firm Surface, EC 30 Sec, Mild Sway  Condition 3: Foam Surface, EO 30 Sec, Normal Sway  Condition 4: Foam Surface, EC 30 Sec, Mild and Moderate Sway     PATIENT EDUCATION: Education details: assessment details, rationale of intervention, education re: BPPV Person educated: Patient and Spouse Education method: Explanation and Handouts Education comprehension: verbalized understanding and needs further education  HOME EXERCISE PROGRAM: Access Code: [email or web address] URL: [email or web address] Date: 12/17/2023 Prepared by: Kelly Merrill Villarruel  Exercises - Standing Gaze Stabilization with Head Rotation  - 1 x daily - 7 x weekly - 3-5 sets - 15-30 sec hold - Standing Gaze Stabilization with Head Nod  - 1 x daily - 7 x weekly - 3-5 sets - 15-30 sec  hold  Note: Objective measures were completed at Evaluation unless otherwise noted.  DIAGNOSTIC FINDINGS:   COGNITION: Overall cognitive status: Within functional limits for tasks assessed   SENSATION: Not tested  EDEMA:     POSTURE:  No Significant postural limitations  Cervical ROM:    Active A/PROM (deg) eval  Flexion 50  Extension 40  Right lateral flexion 45  Left lateral flexion 45  Right rotation 45  Left rotation 35  (Blank rows = not tested)  STRENGTH:   LOWER EXTREMITY MMT:   MMT  Right eval Left eval  Hip flexion    Hip abduction    Hip adduction    Hip internal rotation    Hip external rotation    Knee flexion    Knee extension    Ankle dorsiflexion    Ankle plantarflexion    Ankle inversion    Ankle eversion    (Blank rows = not tested)  BED MOBILITY:  indep  TRANSFERS: indep    GAIT: Gait pattern: unsteady due to symptoms Distance walked:  Assistive device utilized: None Level of assistance: Complete Independence Comments:     PATIENT SURVEYS:  DHI 78%  VESTIBULAR ASSESSMENT:  GENERAL OBSERVATION: wears readers   SYMPTOM BEHAVIOR:  Subjective history: month+ positional dizziness  Non-Vestibular symptoms: neck pain  Type of dizziness: Imbalance (Disequilibrium) and Spinning/Vertigo  Frequency: daily  Duration: seconds-minutes  Aggravating factors: Induced by position change: lying supine, rolling to the right, rolling to the left, supine to sit, and sit to stand and Induced by motion: looking up at the ceiling, bending down to the ground, turning body quickly, and turning head quickly  Relieving factors: closing eyes and slow movements  Progression of symptoms: unchanged  OCULOMOTOR EXAM:  Ocular Alignment: normal  Ocular ROM: No Limitations  Spontaneous Nystagmus: absent  Gaze-Induced Nystagmus: absent  Smooth Pursuits: intact  Saccades: slow  Convergence/Divergence: 8 cm  Symptomatic with left gaze activities/movements   VESTIBULAR - OCULAR REFLEX:   Slow VOR: Comment: symptomatic horizontal > vertical  VOR Cancellation: Normal  Head-Impulse Test: HIT Right: negative HIT Left: negative  Dynamic Visual Acuity:     POSITIONAL TESTING: Right Dix-Hallpike: unable to test Left Dix-Hallpike: upbeating, left nystagmus Right Roll Test: no nystagmus Left Roll Test: upbeating left  MOTION SENSITIVITY:  Motion Sensitivity Quotient Intensity: 0 = none, 1 = Lightheaded, 2 = Mild, 3 = Moderate, 4 = Severe, 5 = Vomiting   Intensity  1. Sitting to supine   2. Supine to L side   3. Supine to R side   4. Supine to sitting   5. L Hallpike-Dix   6. Up from L    7. R Hallpike-Dix   8. Up from R    9. Sitting, head tipped to L knee   10. Head up from L knee   11. Sitting, head tipped to R knee   12. Head up from R knee   13. Sitting head turns x5   14.Sitting head nods x5   15. In stance, 180 turn to L    16. In stance, 180 turn to R     OTHOSTATICS: not done  FUNCTIONAL GAIT: Functional gait assessment: TBD  M-CTSIB: TBD  TREATMENT DATE: 12/14/23   Canalith Repositioning:  Epley Left: Number of Reps: 2 remains symptomatic--suspect right side involved as well    GOALS: Goals reviewed with patient? Yes  SHORT TERM GOALS: Target date: same as LTG    LONG TERM GOALS: Target date: 01/11/2024    The patient will be independent with HEP for gaze adaptation, habituation, balance, and general mobility. Baseline:  Goal status: INITIAL  2.  Report improved symptoms/quality of life per DHI score 60% Baseline: 78% Goal status: INITIAL  3.  Pt to be free of positional dizziness to reduce risk for falls/unsteadiness Baseline: +Left Dix-Hallpike (possibly right as well?) Goal status: INITIAL  4.  Demo improved balance and low risk for falls per score 25/30 Functional Gait Assessment Baseline: TBD Goal status: INITIAL  5.  Demo improved postural stability per mild sway x 30 sec condition 4 M-CTSIB to improve safety with ADL Baseline: TBD Goal status: INITIAL  6  ASSESSMENT:  CLINICAL IMPRESSION: Notes ongoing dizziness/vertigo but to a lesser intensity. No dizziness/nystagmus to right sidelying. Left dix-hallpike w/ small amplitude left upbeating x 12 sec. Left Epley maneuver and absent symptoms to follow-up Dix-Hallpike. M-CTSIB with mild-moderate sway to condition 4  and feeling of imbalance. VOR x 1 with slight symptoms and educated on use of VOR for gentle habituation. Continued sessions to progress POC details to imrpove balance and eliminate symptoms   OBJECTIVE IMPAIRMENTS: Abnormal gait, decreased activity tolerance, decreased balance, difficulty walking, dizziness, and pain.   ACTIVITY LIMITATIONS: carrying, lifting, bending, bed mobility, reach over head, and locomotion level  PARTICIPATION LIMITATIONS: meal prep, cleaning, laundry, driving, shopping, community activity, and yard work  PERSONAL FACTORS: Age, Time since onset of injury/illness/exacerbation, and 1-2 comorbidities: PMH are also affecting patient's functional outcome.   REHAB POTENTIAL: Excellent  CLINICAL DECISION MAKING: Stable/uncomplicated  EVALUATION COMPLEXITY: Low   PLAN:  PT FREQUENCY: 1-2x/week  PT DURATION: 4 weeks  PLANNED INTERVENTIONS: 97750- Physical Performance Testing, 97110-Therapeutic exercises, 97530- Therapeutic activity, [id number]- Neuromuscular re-education, 97535- Self Care, 40981- Manual therapy, [id number]- Gait training, [phone]- Canalith repositioning, [id number]- Aquatic Therapy, W2956- Electrical stimulation (unattended), and 97012- Traction (mechanical)  PLAN FOR NEXT SESSION: re-check left Dix-Hallpike as needed, FGA,, HEP   9:42 AM, 12/17/23 M. Kelly Kealy Lewter, PT, DPT Physical Therapist- [place] Office Number: [phone]

## 2023-12-22 ENCOUNTER — Ambulatory Visit

## 2023-12-22 DIAGNOSIS — R42 Dizziness and giddiness: Secondary | ICD-10-CM | POA: Diagnosis not present

## 2023-12-22 DIAGNOSIS — H8113 Benign paroxysmal vertigo, bilateral: Secondary | ICD-10-CM

## 2023-12-22 NOTE — Therapy (Signed)
 OUTPATIENT PHYSICAL THERAPY VESTIBULAR TREATMENT     Patient Name: Arthur Brady MRN: [id number] DOB:1941/04/18, 83 y.o., male Today's Date: 12/22/2023  END OF SESSION:  PT End of Session - 12/22/23 1102     Visit Number 3    Number of Visits 8    Date for PT Re-Evaluation 01/11/24    Authorization Type [employer] Medicare    Progress Note Due on Visit 10    PT Start Time 1100    PT Stop Time 1145    PT Time Calculation (min) 45 min          Past Medical History:  Diagnosis Date   Abdominal pain    Chronic kidney disease    has had kidney stones   DJD (degenerative joint disease) of knee    GERD (gastroesophageal reflux disease)    Lumbar degenerative disc disease    Malignant hyperthermia    NSAID long-term use    NSAID long-term use    Pulmonary embolism, iatrogenic (HCC)    Shortness of breath    Past Surgical History:  Procedure Laterality Date   arthroscopy of knee left     BACK SURGERY     Patient Active Problem List   Diagnosis Date Noted   Hemiplegia of dominant side (HCC) 04/11/2012   Eye pain 04/11/2012   Rash 04/11/2012   Abdominal pain 05/08/2011   Hydronephrosis, left 05/08/2011   NSAID long-term use     PCP: Charlott Dorn LABOR, MD REFERRING PROVIDER: Charlott Dorn LABOR, MD  REFERRING DIAG: H81.10 (ICD-10-CM) - Benign paroxysmal vertigo, unspecified ear  THERAPY DIAG:  Dizziness and giddiness  BPPV (benign paroxysmal positional vertigo), bilateral  ONSET DATE: early May  Rationale for Evaluation and Treatment: Rehabilitation  SUBJECTIVE:   SUBJECTIVE STATEMENT: Feeling better, no episodes of vertigo. Balance and visual focus seem to be better. Neck is still a little stiff Pt accompanied by: significant other  PERTINENT HISTORY:   PAIN:  Are you having pain? Yes: NPRS scale: 1-2/10 Pain location: left cervical column Pain description: ache/sore Aggravating factors: unsure Relieving factors:    PRECAUTIONS: None  RED  FLAGS: None   WEIGHT BEARING RESTRICTIONS: No  FALLS: Has patient fallen in last 6 months? No  LIVING ENVIRONMENT: Lives with: lives with their spouse Lives in: House/apartment-one level home Stairs: stairs to enter Has following equipment at home: None  PLOF: Independent  PATIENT GOALS:   OBJECTIVE:   TODAY'S TREATMENT: 12/22/23 Activity Comments  Roll test No dizziness/nystagmus  Loaded left Dix-Hallpike No dizziness/nystagmus  FGA 27/30  Standing VOR x 1  30 sec; mild symptoms horizontal, no issue vertical  DHI 6%        [facility] PT Assessment - 12/22/23 0001       Functional Gait  Assessment   Gait assessed  Yes    Gait Level Surface Walks 20 ft in less than 5.5 sec, no assistive devices, good speed, no evidence for imbalance, normal gait pattern, deviates no more than 6 in outside of the 12 in walkway width.    Change in Gait Speed Able to smoothly change walking speed without loss of balance or gait deviation. Deviate no more than 6 in outside of the 12 in walkway width.    Gait with Horizontal Head Turns Performs head turns smoothly with no change in gait. Deviates no more than 6 in outside 12 in walkway width    Gait with Vertical Head Turns Performs head turns with no change in gait.  Deviates no more than 6 in outside 12 in walkway width.    Gait and Pivot Turn Pivot turns safely within 3 sec and stops quickly with no loss of balance.    Step Over Obstacle Is able to step over 2 stacked shoe boxes taped together (9 in total height) without changing gait speed. No evidence of imbalance.    Gait with Narrow Base of Support Ambulates 7-9 steps.    Gait with Eyes Closed Walks 20 ft, slow speed, abnormal gait pattern, evidence for imbalance, deviates 10-15 in outside 12 in walkway width. Requires more than 9 sec to ambulate 20 ft.    Ambulating Backwards Walks 20 ft, no assistive devices, good speed, no evidence for imbalance, normal gait    Steps Alternating feet, no rail.     Total Score 27          TODAY'S TREATMENT: 12/17/23 Activity Comments  Right sidelying No nystagmus, minimal dizziness  Left Dix-Hallpike-loaded Small ampitude left upbeating x 12 sec  Left Epley   Left dix-hallpike-loaded No dizziness/no nystagmus  M-CTSIB See below  Standing VOR x 1      M-CTSIB  Condition 1: Firm Surface, EO 30 Sec, Normal Sway  Condition 2: Firm Surface, EC 30 Sec, Mild Sway  Condition 3: Foam Surface, EO 30 Sec, Normal Sway  Condition 4: Foam Surface, EC 30 Sec, Mild and Moderate Sway     PATIENT EDUCATION: Education details: assessment details, rationale of intervention, education re: BPPV Person educated: Patient and Spouse Education method: Explanation and Handouts Education comprehension: verbalized understanding and needs further education  HOME EXERCISE PROGRAM: Access Code: [email or web address] URL: [email or web address] Date: 12/17/2023 Prepared by: Burnard Sandifer  Exercises - Standing Gaze Stabilization with Head Rotation  - 1 x daily - 7 x weekly - 3-5 sets - 15-30 sec hold - Standing Gaze Stabilization with Head Nod  - 1 x daily - 7 x weekly - 3-5 sets - 15-30 sec hold  Note: Objective measures were completed at Evaluation unless otherwise noted.  DIAGNOSTIC FINDINGS:   COGNITION: Overall cognitive status: Within functional limits for tasks assessed   SENSATION: Not tested  EDEMA:     POSTURE:  No Significant postural limitations  Cervical ROM:    Active A/PROM (deg) eval  Flexion 50  Extension 40  Right lateral flexion 45  Left lateral flexion 45  Right rotation 45  Left rotation 35  (Blank rows = not tested)  STRENGTH:   LOWER EXTREMITY MMT:   MMT Right eval Left eval  Hip flexion    Hip abduction    Hip adduction    Hip internal rotation    Hip external rotation    Knee flexion    Knee extension    Ankle dorsiflexion    Ankle plantarflexion    Ankle inversion    Ankle eversion    (Blank rows =  not tested)  BED MOBILITY:  indep  TRANSFERS: indep    GAIT: Gait pattern: unsteady due to symptoms Distance walked:  Assistive device utilized: None Level of assistance: Complete Independence Comments:     PATIENT SURVEYS:  DHI 78%  VESTIBULAR ASSESSMENT:  GENERAL OBSERVATION: wears readers   SYMPTOM BEHAVIOR:  Subjective history: month+ positional dizziness  Non-Vestibular symptoms: neck pain  Type of dizziness: Imbalance (Disequilibrium) and Spinning/Vertigo  Frequency: daily  Duration: seconds-minutes  Aggravating factors: Induced by position change: lying supine, rolling to the right, rolling to the left, supine to sit, and sit to stand and  Induced by motion: looking up at the ceiling, bending down to the ground, turning body quickly, and turning head quickly  Relieving factors: closing eyes and slow movements  Progression of symptoms: unchanged  OCULOMOTOR EXAM:  Ocular Alignment: normal  Ocular ROM: No Limitations  Spontaneous Nystagmus: absent  Gaze-Induced Nystagmus: absent  Smooth Pursuits: intact  Saccades: slow  Convergence/Divergence: 8 cm  Symptomatic with left gaze activities/movements   VESTIBULAR - OCULAR REFLEX:   Slow VOR: Comment: symptomatic horizontal > vertical  VOR Cancellation: Normal  Head-Impulse Test: HIT Right: negative HIT Left: negative  Dynamic Visual Acuity:     POSITIONAL TESTING: Right Dix-Hallpike: unable to test Left Dix-Hallpike: upbeating, left nystagmus Right Roll Test: no nystagmus Left Roll Test: upbeating left  MOTION SENSITIVITY:  Motion Sensitivity Quotient Intensity: 0 = none, 1 = Lightheaded, 2 = Mild, 3 = Moderate, 4 = Severe, 5 = Vomiting  Intensity  1. Sitting to supine   2. Supine to L side   3. Supine to R side   4. Supine to sitting   5. L Hallpike-Dix   6. Up from L    7. R Hallpike-Dix   8. Up from R    9. Sitting, head tipped to L knee   10. Head up from L knee   11. Sitting, head tipped  to R knee   12. Head up from R knee   13. Sitting head turns x5   14.Sitting head nods x5   15. In stance, 180 turn to L    16. In stance, 180 turn to R     OTHOSTATICS: not done  FUNCTIONAL GAIT: Functional gait assessment: TBD  M-CTSIB: TBD                                                                                                                            TREATMENT DATE: 12/14/23   Canalith Repositioning:  Epley Left: Number of Reps: 2 remains symptomatic--suspect right side involved as well    GOALS: Goals reviewed with patient? Yes  SHORT TERM GOALS: Target date: same as LTG    LONG TERM GOALS: Target date: 01/11/2024    The patient will be independent with HEP for gaze adaptation, habituation, balance, and general mobility. Baseline:  Goal status: MET  2.  Report improved symptoms/quality of life per DHI score 60% Baseline: 78%; 6/100 Goal status: MET  3.  Pt to be free of positional dizziness to reduce risk for falls/unsteadiness Baseline: +Left Dix-Hallpike (possibly right as well?); resolved Goal status: MET  4.  Demo improved balance and low risk for falls per score 25/30 Functional Gait Assessment Baseline: 27/30 Goal status: MET  5.  Demo improved postural stability per mild sway x 30 sec condition 4 M-CTSIB to improve safety with ADL Baseline: mild Goal status: MET    ASSESSMENT:  CLINICAL IMPRESSION: Reports much improved symptoms over the past few days without any episodes of positional dizziness and resumed typical  level of activity.  Positional tests reveal no nystagmus or vertigo and only slight discomfort to left Dix-Hallpike position.  Minimal issue to VOR with only slight discomfort to horizontal plane.  Mild sway to condition 4 M-CTSIB and score 27/30 Functional Gait Assessment indicating low risk for falls.  Goals met at this time and pt comfortable with self-management at this time.  We will leave the chart open x 30 days in case of  return of BPPV for f/u as needed.   OBJECTIVE IMPAIRMENTS: Abnormal gait, decreased activity tolerance, decreased balance, difficulty walking, dizziness, and pain.   ACTIVITY LIMITATIONS: carrying, lifting, bending, bed mobility, reach over head, and locomotion level  PARTICIPATION LIMITATIONS: meal prep, cleaning, laundry, driving, shopping, community activity, and yard work  PERSONAL FACTORS: Age, Time since onset of injury/illness/exacerbation, and 1-2 comorbidities: PMH are also affecting patient's functional outcome.   REHAB POTENTIAL: Excellent  CLINICAL DECISION MAKING: Stable/uncomplicated  EVALUATION COMPLEXITY: Low   PLAN:  PT FREQUENCY: 1-2x/week  PT DURATION: 4 weeks  PLANNED INTERVENTIONS: 97750- Physical Performance Testing, 97110-Therapeutic exercises, 97530- Therapeutic activity, [id number]- Neuromuscular re-education, 97535- Self Care, [id number]- Manual therapy, [id number]- Gait training, [phone]- Canalith repositioning, [id number]- Aquatic Therapy, [id number]- Electrical stimulation (unattended), and [id number]- Traction (mechanical)  PLAN FOR NEXT SESSION: chart hold x 30 days   11:02 AM, 12/22/23 M. Kelly Marzetta Lanza, PT, DPT Physical Therapist-  Office Number: [phone]

## 2023-12-24 ENCOUNTER — Ambulatory Visit

## 2023-12-29 ENCOUNTER — Ambulatory Visit

## 2024-01-04 ENCOUNTER — Encounter

## 2024-01-06 ENCOUNTER — Encounter

## 2024-01-11 ENCOUNTER — Encounter

## 2024-01-12 DIAGNOSIS — K080 Exfoliation of teeth due to systemic causes: Secondary | ICD-10-CM | POA: Diagnosis not present

## 2024-01-13 ENCOUNTER — Encounter

## 2024-01-18 ENCOUNTER — Encounter

## 2024-01-20 ENCOUNTER — Encounter

## 2024-01-20 DIAGNOSIS — K080 Exfoliation of teeth due to systemic causes: Secondary | ICD-10-CM | POA: Diagnosis not present

## 2024-02-03 DIAGNOSIS — K080 Exfoliation of teeth due to systemic causes: Secondary | ICD-10-CM | POA: Diagnosis not present

## 2024-03-03 DIAGNOSIS — K080 Exfoliation of teeth due to systemic causes: Secondary | ICD-10-CM | POA: Diagnosis not present

## 2024-03-13 DIAGNOSIS — K080 Exfoliation of teeth due to systemic causes: Secondary | ICD-10-CM | POA: Diagnosis not present

## 2024-03-16 DIAGNOSIS — L821 Other seborrheic keratosis: Secondary | ICD-10-CM | POA: Diagnosis not present

## 2024-03-16 DIAGNOSIS — L814 Other melanin hyperpigmentation: Secondary | ICD-10-CM | POA: Diagnosis not present

## 2024-03-16 DIAGNOSIS — L578 Other skin changes due to chronic exposure to nonionizing radiation: Secondary | ICD-10-CM | POA: Diagnosis not present

## 2024-03-16 DIAGNOSIS — D229 Melanocytic nevi, unspecified: Secondary | ICD-10-CM | POA: Diagnosis not present

## 2024-04-05 DIAGNOSIS — K080 Exfoliation of teeth due to systemic causes: Secondary | ICD-10-CM | POA: Diagnosis not present

## 2024-06-02 DIAGNOSIS — K219 Gastro-esophageal reflux disease without esophagitis: Secondary | ICD-10-CM | POA: Diagnosis not present

## 2024-06-02 DIAGNOSIS — I10 Essential (primary) hypertension: Secondary | ICD-10-CM | POA: Diagnosis not present
